# Patient Record
Sex: Female | Born: 1950 | Race: White | Hispanic: No | Marital: Married
Health system: Southern US, Community
[De-identification: ages and names within clinical notes are randomized; demographics above are authoritative.]

---

## 2020-05-29 ENCOUNTER — Ambulatory Visit: Payer: Medicare HMO | Admitting: Family Medicine

## 2020-05-29 ENCOUNTER — Ambulatory Visit: Payer: Self-pay

## 2020-05-29 ENCOUNTER — Encounter: Payer: Self-pay | Admitting: Family Medicine

## 2020-05-29 ENCOUNTER — Other Ambulatory Visit: Payer: Self-pay

## 2020-05-29 VITALS — BP 118/80 | HR 68 | Ht 63.75 in | Wt 123.0 lb

## 2020-05-29 DIAGNOSIS — M79672 Pain in left foot: Secondary | ICD-10-CM | POA: Diagnosis not present

## 2020-05-29 DIAGNOSIS — M216X1 Other acquired deformities of right foot: Secondary | ICD-10-CM | POA: Insufficient documentation

## 2020-05-29 DIAGNOSIS — D3610 Benign neoplasm of peripheral nerves and autonomic nervous system, unspecified: Secondary | ICD-10-CM | POA: Diagnosis not present

## 2020-05-29 MED ORDER — VITAMIN D (ERGOCALCIFEROL) 1.25 MG (50000 UNIT) PO CAPS
50000.0000 [IU] | ORAL_CAPSULE | ORAL | 0 refills | Status: DC
Start: 2020-05-29 — End: 2020-08-13

## 2020-05-29 NOTE — Assessment & Plan Note (Signed)
If worsening pain consider gabapentin or potential injections but I am not optimistic patient should do relatively well

## 2020-05-29 NOTE — Progress Notes (Signed)
[name] [place] Phone: [phone] Subjective:   Marissa [name], am serving as a scribe for Dr. Hulan Saas.  This visit occurred during the SARS-CoV-2 public health emergency.  Safety protocols were in place, including screening questions prior to the visit, additional usage of staff PPE, and extensive cleaning of exam room while observing appropriate contact time as indicated for disinfecting solutions.   I'm seeing this patient by the request  of:  Marissa [name]., PA-C  CC: Foot pain  Marissa [name]  Marissa [name] is a 69 y.o. female coming in with complaint of left foot pain. 11 years ago fractured her foot. After meds and therapy after years no relief went to Dr. Kathlen [name] and did ART therapy. Has had no pain in 8 years.  Onset- 1 year ago felt the pain again  Location- sesmoid bone  Character- aching ,numbness, and tingling Therapies tried- ice  Severity- irritating pain       Social History   Socioeconomic History  . Marital status: Married    Spouse name: Not on file  . Number of children: Not on file  . Years of education: Not on file  . Highest education level: Not on file  Occupational History  . Not on file  Tobacco Use  . Smoking status: Not on file  Substance and Sexual Activity  . Alcohol use: Not on file  . Drug use: Not on file  . Sexual activity: Not on file  Other Topics Concern  . Not on file  Social History Narrative  . Not on file   Social Determinants of Health   Financial Resource Strain:   . Difficulty of Paying Living Expenses:   Food Insecurity:   . Worried About Charity fundraiser in the Last Year:   . Arboriculturist in the Last Year:   Transportation Needs:   . Film/video editor (Medical):   Marland Kitchen Lack of Transportation (Non-Medical):   Physical Activity:   . Days of Exercise per Week:   . Minutes of Exercise per Session:   Stress:   . Feeling of Stress :    Social Connections:   . Frequency of Communication with Friends and Family:   . Frequency of Social Gatherings with Friends and Family:   . Attends Religious Services:   . Active Member of Clubs or Organizations:   . Attends Archivist Meetings:   Marland Kitchen Marital Status:    Allergies  Allergen Reactions  . Penicillins Other (See Comments) and Hives  . Codeine Other (See Comments)    Other  . Pseudoephedrine Hcl Nausea Only   No family history on file.       Current Outpatient Medications (Other):  Marland Kitchen  Vitamin D, Ergocalciferol, (DRISDOL) 1.25 MG (50000 UNIT) CAPS capsule, Take 1 capsule (50,000 Units total) by mouth every 7 (seven) days.   Reviewed prior external information including notes and imaging from  primary care provider As well as notes that were available from care everywhere and other healthcare systems.  Past medical history, social, surgical and family history all reviewed in electronic medical record.  No pertanent information unless stated regarding to the chief complaint.   Review of Systems:  No headache, visual changes, nausea, vomiting, diarrhea, constipation, dizziness, abdominal pain, skin rash, fevers, chills, night sweats, weight loss, swollen lymph nodes, body aches, joint swelling, chest pain, shortness of breath, mood changes. POSITIVE muscle aches  Objective  Blood pressure 118/80, pulse 68, height 5' 3.75" (1.619 m), weight 123 lb (55.8 kg), SpO2 99 %.   General: No apparent distress alert and oriented x3 mood and affect normal, dressed appropriately.  HEENT: Pupils equal, extraocular movements intact  Respiratory: Patient's speak in full sentences and does not appear short of breath  Cardiovascular: No lower extremity edema, non tender, no erythema  Neuro: Cranial nerves II through XII are intact, neurovascularly intact in all extremities with 2+ DTRs and 2+ pulses.  Gait normal with good balance and coordination.  MSK.  Patient's right  foot exam shows the patient does have some mild splaying between the first and second toes with breakdown of the transverse arch noted.  Mild positive squeeze test noted.  Patient sesamoid bone is nontender but have actually more tenderness between the second and third toes.  Relatively good range of motion of the first metatarsal.  Limited musculoskeletal ultrasound was performed and interpreted by Lyndal Pulley  Limited ultrasound of patient's foot shows the patient has minimal arthritic changes of the first MTP.  Trace effusion noted.  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  Patient does have what appears to be a very small neuroma starting between the second and third metatarsal toes and with compression patient has discomfort in this area.  Otherwise fairly unremarkable Impression: Neuroma    Impression and Recommendations:     The above documentation has been reviewed and is accurate and complete Lyndal Pulley, DO       Note: This dictation was prepared with Dragon dictation along with smaller phrase technology. Any transcriptional errors that result from this process are unintentional.

## 2020-05-29 NOTE — Assessment & Plan Note (Signed)
Transverse arch.  Patient does have breakdown and does have what appears to be more of a neuroma of the foot.  We discussed metatarsal pads, proper shoes, home exercises vitamin D supplementation, which activities to do which wants to avoid.  Follow-up again in 6 weeks

## 2020-05-29 NOTE — Patient Instructions (Addendum)
Good to see you Exercise 3 times a week Terrence Dupont or newton shoes Spenco "orthotics" Once weekly vitamin D Ice after activity See me again in 5-6 weeks

## 2020-06-08 ENCOUNTER — Encounter: Payer: Self-pay | Admitting: Family Medicine

## 2020-06-09 ENCOUNTER — Ambulatory Visit: Payer: Self-pay | Admitting: Family Medicine

## 2020-06-15 ENCOUNTER — Encounter: Payer: Self-pay | Admitting: Family Medicine

## 2020-07-03 ENCOUNTER — Other Ambulatory Visit: Payer: Self-pay

## 2020-07-03 ENCOUNTER — Ambulatory Visit: Payer: Medicare HMO | Admitting: Family Medicine

## 2020-07-03 ENCOUNTER — Encounter: Payer: Self-pay | Admitting: Family Medicine

## 2020-07-03 ENCOUNTER — Ambulatory Visit: Payer: Self-pay

## 2020-07-03 VITALS — BP 120/90 | HR 69 | Ht 63.75 in | Wt 121.0 lb

## 2020-07-03 DIAGNOSIS — D3610 Benign neoplasm of peripheral nerves and autonomic nervous system, unspecified: Secondary | ICD-10-CM | POA: Diagnosis not present

## 2020-07-03 DIAGNOSIS — M79672 Pain in left foot: Secondary | ICD-10-CM

## 2020-07-03 MED ORDER — PREDNISONE 50 MG PO TABS
ORAL_TABLET | ORAL | 0 refills | Status: DC
Start: 2020-07-03 — End: 2020-09-03

## 2020-07-03 MED ORDER — GABAPENTIN 100 MG PO CAPS
200.0000 mg | ORAL_CAPSULE | Freq: Every day | ORAL | 3 refills | Status: AC
Start: 2020-07-03 — End: ?

## 2020-07-03 NOTE — Progress Notes (Signed)
[facility] [address] [place] Phone: [phone] Subjective:   I Marissa [name] am serving as a Education administrator for Dr. Hulan Saas.  This visit occurred during the SARS-CoV-2 public health emergency.  Safety protocols were in place, including screening questions prior to the visit, additional usage of staff PPE, and extensive cleaning of exam room while observing appropriate contact time as indicated for disinfecting solutions.   I'm seeing this patient by the request  of:  Aletha Halim., PA-C  CC: Foot pain follow-up  [id number]   05/29/2020 Transverse arch.  Patient does have breakdown and does have what appears to be more of a neuroma of the foot.  We discussed metatarsal pads, proper shoes, home exercises vitamin D supplementation, which activities to do which wants to avoid.  Follow-up again in 6 weeks  If worsening pain consider gabapentin or potential injections but I am not optimistic patient should do relatively well  07/03/2020 Marissa [name] is a 69 y.o. female coming in with complaint of left foot pain. Patient states it took about 10 days to get orthotics so she has been wearing them for 4 weeks. Took a while to get used to and she thought she was getting better. Walking a mile a day. Good and bad days but yesterday she over did it. Walked a mile and a half and cleaned her house. Burning and aching. States she iced her foot. Last night the top of her toes turned red as well as the ball of her foot. There was a burning sensation. Overall thought she was getting better. Wants to go on vacation and walk and thought that increasing activity would be appropriate in order to prepare for the walking she will be doing on vacation. States she has had injections about 10-11 years ago and states they did not help. Exercising. Still taking vitamin D.   Onset-  Location Duration-  Character- Aggravating factors- Reliving factors-    Therapies tried-  Severity-     No past medical history on file. No past surgical history on file. Social History   Socioeconomic History  . Marital status: Married    Spouse name: Not on file  . Number of children: Not on file  . Years of education: Not on file  . Highest education level: Not on file  Occupational History  . Not on file  Tobacco Use  . Smoking status: Not on file  Substance and Sexual Activity  . Alcohol use: Not on file  . Drug use: Not on file  . Sexual activity: Not on file  Other Topics Concern  . Not on file  Social History Narrative  . Not on file   Social Determinants of Health   Financial Resource Strain:   . Difficulty of Paying Living Expenses:   Food Insecurity:   . Worried About Charity fundraiser in the Last Year:   . Arboriculturist in the Last Year:   Transportation Needs:   . Film/video editor (Medical):   Marland Kitchen Lack of Transportation (Non-Medical):   Physical Activity:   . Days of Exercise per Week:   . Minutes of Exercise per Session:   Stress:   . Feeling of Stress :   Social Connections:   . Frequency of Communication with Friends and Family:   . Frequency of Social Gatherings with Friends and Family:   . Attends Religious Services:   . Active Member of Clubs or Organizations:   .  Attends Archivist Meetings:   Marland Kitchen Marital Status:    Allergies  Allergen Reactions  . Penicillins Other (See Comments) and Hives  . Codeine Other (See Comments)    Other  . Pseudoephedrine Hcl Nausea Only   No family history on file.  Current Outpatient Medications (Endocrine & Metabolic):  .  predniSONE (DELTASONE) 50 MG tablet, 1 tablet by mouth daily      Current Outpatient Medications (Other):  Marland Kitchen  Vitamin D, Ergocalciferol, (DRISDOL) 1.25 MG (50000 UNIT) CAPS capsule, Take 1 capsule (50,000 Units total) by mouth every 7 (seven) days. Marland Kitchen  gabapentin (NEURONTIN) 100 MG capsule, Take 2 capsules (200 mg total) by mouth at  bedtime.   Reviewed prior external information including notes and imaging from  primary care provider As well as notes that were available from care everywhere and other healthcare systems.  Past medical history, social, surgical and family history all reviewed in electronic medical record.  No pertanent information unless stated regarding to the chief complaint.   Review of Systems:  No headache, visual changes, nausea, vomiting, diarrhea, constipation, dizziness, abdominal pain, skin rash, fevers, chills, night sweats, weight loss, swollen lymph nodes, body aches, joint swelling, chest pain, shortness of breath, mood changes. POSITIVE muscle aches  Objective  Blood pressure (!) 120/90, pulse 69, height 5' 3.75" (1.619 m), weight 121 lb (54.9 kg), SpO2 96 %.   General: No apparent distress alert and oriented x3 mood and affect normal, dressed appropriately.  HEENT: Pupils equal, extraocular movements intact  Respiratory: Patient's speak in full sentences and does not appear short of breath  Cardiovascular: No lower extremity edema, non tender, no erythema  Neuro: Cranial nerves II through XII are intact, neurovascularly intact in all extremities with 2+ DTRs and 2+ pulses.  Gait normal with good balance and coordination.  MSK:  Non tender with full range of motion and good stability and symmetric strength and tone of shoulders, elbows, wrist, hip, knee and ankles bilaterally.  Left foot exam shows a.  There is a breakdown of the longitudinal arch.  Patient does have a positive squeeze test noted.  No swelling no noted.  Mild rigid midfoot  Limited musculoskeletal ultrasound was performed and interpreted by Lyndal Pulley  Limited ultrasound shows the patient does have neuroma still noted in the second inter metatarsal space.  Hypoechoic changes.  No masses appreciated.  No neovascularization   Impression and Recommendations:     The above documentation has been reviewed and is  accurate and complete Lyndal Pulley, DO       Note: This dictation was prepared with Dragon dictation along with smaller phrase technology. Any transcriptional errors that result from this process are unintentional.

## 2020-07-03 NOTE — Assessment & Plan Note (Signed)
Patient is a neuroma of the foot but does not want to do any injection.  Is making slow progress.  Given gabapentin as well as now prednisone no for any type of breakthrough.  Discussed icing regimen.  Increase activity slowly.  Continue with the over-the-counter orthotics and good shoes.  Follow-up again 6 weeks

## 2020-07-03 NOTE — Patient Instructions (Addendum)
Good to see you Keep doing exercises Prednisone take on trip 7 days take it daily if pain starts Gabapentin 200 mg at night  Ice bath may be better See me again in 2 months

## 2020-08-13 ENCOUNTER — Other Ambulatory Visit: Payer: Self-pay | Admitting: Family Medicine

## 2020-08-14 ENCOUNTER — Other Ambulatory Visit: Payer: Self-pay

## 2020-09-02 NOTE — Progress Notes (Signed)
[place] Phone: [phone] Subjective:   Fontaine No, am serving as a scribe for Dr. Hulan Saas. This visit occurred during the SARS-CoV-2 public health emergency.  Safety protocols were in place, including screening questions prior to the visit, additional usage of staff PPE, and extensive cleaning of exam room while observing appropriate contact time as indicated for disinfecting solutions.   I'm seeing this patient by the request  of:  Aletha Halim., PA-C  CC: Foot pain follow-up  [id number]   07/03/2020 Patient is a neuroma of the foot but does not want to do any injection.  Is making slow progress.  Given gabapentin as well as now prednisone no for any type of breakthrough.  Discussed icing regimen.  Increase activity slowly.  Continue with the over-the-counter orthotics and good shoes.  Follow-up again 6 weeks  Update 09/03/2020 Marissa [name] is a 69 y.o. female coming in with complaint of left foot pain. Patient states that prednisone helped to decrease her pain while on vacation. Was able to walk 4 miles a day. Now that she is back in her routine at home, she has burning, numbness and tingling. Cannot figure out pattern to her pain. Using more supportive shoe and orthotics.  Overall feels approximately 50 to 60% better.      No past medical history on file. No past surgical history on file. Social History   Socioeconomic History  . Marital status: Married    Spouse name: Not on file  . Number of children: Not on file  . Years of education: Not on file  . Highest education level: Not on file  Occupational History  . Not on file  Tobacco Use  . Smoking status: Not on file  Substance and Sexual Activity  . Alcohol use: Not on file  . Drug use: Not on file  . Sexual activity: Not on file  Other Topics Concern  . Not on file  Social History Narrative  . Not on file   Social  Determinants of Health   Financial Resource Strain:   . Difficulty of Paying Living Expenses: Not on file  Food Insecurity:   . Worried About Charity fundraiser in the Last Year: Not on file  . Ran Out of Food in the Last Year: Not on file  Transportation Needs:   . Lack of Transportation (Medical): Not on file  . Lack of Transportation (Non-Medical): Not on file  Physical Activity:   . Days of Exercise per Week: Not on file  . Minutes of Exercise per Session: Not on file  Stress:   . Feeling of Stress : Not on file  Social Connections:   . Frequency of Communication with Friends and Family: Not on file  . Frequency of Social Gatherings with Friends and Family: Not on file  . Attends Religious Services: Not on file  . Active Member of Clubs or Organizations: Not on file  . Attends Archivist Meetings: Not on file  . Marital Status: Not on file   Allergies  Allergen Reactions  . Penicillins Other (See Comments) and Hives  . Codeine Other (See Comments)    Other  . Pseudoephedrine Hcl Nausea Only   No family history on file.       Current Outpatient Medications (Other):  .  gabapentin (NEURONTIN) 100 MG capsule, Take 2 capsules (200 mg total) by mouth at bedtime. .  Vitamin D, Ergocalciferol, (  DRISDOL) 1.25 MG (50000 UNIT) CAPS capsule, TAKE 1 CAPSULE (50,000 UNITS TOTAL) BY MOUTH EVERY 7 (SEVEN) DAYS.   Reviewed prior external information including notes and imaging from  primary care provider As well as notes that were available from care everywhere and other healthcare systems.  Past medical history, social, surgical and family history all reviewed in electronic medical record.  No pertanent information unless stated regarding to the chief complaint.   Review of Systems:  No headache, visual changes, nausea, vomiting, diarrhea, constipation, dizziness, abdominal pain, skin rash, fevers, chills, night sweats, weight loss, swollen lymph nodes, body aches,  joint swelling, chest pain, shortness of breath, mood changes. POSITIVE muscle aches  Objective  Blood pressure 112/72, pulse 69, height 5\' 3"  (1.6 m), weight 124 lb (56.2 kg), SpO2 96 %.   General: No apparent distress alert and oriented x3 mood and affect normal, dressed appropriately.  HEENT: Pupils equal, extraocular movements intact  Respiratory: Patient's speak in full sentences and does not appear short of breath  Cardiovascular: No lower extremity edema, non tender, no erythema  Neuro: Cranial nerves II through XII are intact, neurovascularly intact in all extremities with 2+ DTRs and 2+ pulses.  Gait normal with good balance and coordination.  MSK: Foot exam shows the patient has very mild breakdown of the transverse arch.  Negative squeeze test.  Patient's phalanx of the fifth toe and does have some mild swelling from a new injury.  Patient's ankle full range of motion.  Limited musculoskeletal ultrasound was performed and interpreted Lyndal Pulley  Limited ultrasound of patient's left foot shows no significant enlargement of the neuroma today.  No cortical irregularities anywhere except for the fifth phalanx but has what it seems to be a resolving fracture noted.    Impression and Recommendations:     The above documentation has been reviewed and is accurate and complete Lyndal Pulley, DO       Note: This dictation was prepared with Dragon dictation along with smaller phrase technology. Any transcriptional errors that result from this process are unintentional.

## 2020-09-03 ENCOUNTER — Ambulatory Visit: Payer: Self-pay

## 2020-09-03 ENCOUNTER — Encounter: Payer: Self-pay | Admitting: Family Medicine

## 2020-09-03 ENCOUNTER — Ambulatory Visit (INDEPENDENT_AMBULATORY_CARE_PROVIDER_SITE_OTHER): Payer: Medicare HMO | Admitting: Family Medicine

## 2020-09-03 ENCOUNTER — Other Ambulatory Visit: Payer: Self-pay

## 2020-09-03 VITALS — BP 112/72 | HR 69 | Ht 63.0 in | Wt 124.0 lb

## 2020-09-03 DIAGNOSIS — M79672 Pain in left foot: Secondary | ICD-10-CM

## 2020-09-03 DIAGNOSIS — D3610 Benign neoplasm of peripheral nerves and autonomic nervous system, unspecified: Secondary | ICD-10-CM

## 2020-09-03 NOTE — Assessment & Plan Note (Signed)
No sign of any neuroma noted.  Patient is doing much better.  Encouraged her to continue to do the gabapentin but can cut down to 100 mg.  Discussed the vitamin D supplementation to more over-the-counter.  I believe patient should do relatively well.  Discussed icing regimen and home exercise.  Patient was looking at the possibility of food exacerbating this and we discussed potentially online ones that could be beneficial.  Follow-up with me again 2 months

## 2020-09-03 NOTE — Patient Instructions (Addendum)
[email or web address] for food sensitivity testing 2000-4000IU Vit D Daily Tart Cherry extract 1200mg  at night [email or web address] See me once more in 2 months

## 2020-09-15 ENCOUNTER — Encounter: Payer: Self-pay | Admitting: Family Medicine

## 2020-09-22 ENCOUNTER — Ambulatory Visit (INDEPENDENT_AMBULATORY_CARE_PROVIDER_SITE_OTHER): Payer: Medicare HMO

## 2020-09-22 ENCOUNTER — Encounter: Payer: Self-pay | Admitting: Family Medicine

## 2020-09-22 ENCOUNTER — Other Ambulatory Visit: Payer: Self-pay

## 2020-09-22 DIAGNOSIS — M79672 Pain in left foot: Secondary | ICD-10-CM

## 2020-09-24 ENCOUNTER — Ambulatory Visit: Payer: Self-pay

## 2020-09-24 ENCOUNTER — Ambulatory Visit: Payer: Medicare HMO | Admitting: Family Medicine

## 2020-09-24 ENCOUNTER — Other Ambulatory Visit: Payer: Self-pay

## 2020-09-24 VITALS — BP 102/68 | HR 56 | Ht 63.0 in | Wt 129.0 lb

## 2020-09-24 DIAGNOSIS — M79672 Pain in left foot: Secondary | ICD-10-CM | POA: Diagnosis not present

## 2020-09-24 DIAGNOSIS — M216X1 Other acquired deformities of right foot: Secondary | ICD-10-CM

## 2020-09-24 MED ORDER — PREDNISONE 50 MG PO TABS: ORAL_TABLET | ORAL | 0 refills | Status: DC

## 2020-09-24 NOTE — Progress Notes (Signed)
[place] Phone: [phone] Subjective:   Marissa [name], am serving as a scribe for Dr. Hulan Saas. This visit occurred during the SARS-CoV-2 public health emergency.  Safety protocols were in place, including screening questions prior to the visit, additional usage of staff PPE, and extensive cleaning of exam room while observing appropriate contact time as indicated for disinfecting solutions.   I'm seeing this patient by the request  of:  Deatra Ina, Kristen W., PA-C  CC: left foot pain   [id number]   09/03/2020 [name] sign of any neuroma noted.  Patient is doing much better.  Encouraged her to continue to do the gabapentin but can cut down to 100 mg.  Discussed the vitamin D supplementation to more over-the-counter.  I believe patient should do relatively well.  Discussed icing regimen and home exercise.  Patient was looking at the possibility of food exacerbating this and we discussed potentially online ones that could be beneficial.  Follow-up with me again 2 months   Update 09/24/2020 Marissa [name] is a 69 y.o. female coming in with complaint of left foot pain. Patient states that she wore some new riding boots and within 30 minutes her pinky toe has been painful. Pain over all the metatarsal heads. The only thing different was new motorcycle boots and now continues to have pain   Pain from neuroma has improved.     New xrays taken today- [name] sign of any bony abnormality  [name] past medical history on file. [name] past surgical history on file. Social History   Socioeconomic History  . Marital status: Married    Spouse name: Not on file  . Number of children: Not on file  . Years of education: Not on file  . Highest education level: Not on file  Occupational History  . Not on file  Tobacco Use  . Smoking status: Not on file  Substance and Sexual Activity  . Alcohol use: Not on file  . Drug use: Not on file   . Sexual activity: Not on file  Other Topics Concern  . Not on file  Social History Narrative  . Not on file   Social Determinants of Health   Financial Resource Strain:   . Difficulty of Paying Living Expenses: Not on file  Food Insecurity:   . Worried About Charity fundraiser in the Last Year: Not on file  . Ran Out of Food in the Last Year: Not on file  Transportation Needs:   . Lack of Transportation (Medical): Not on file  . Lack of Transportation (Non-Medical): Not on file  Physical Activity:   . Days of Exercise per Week: Not on file  . Minutes of Exercise per Session: Not on file  Stress:   . Feeling of Stress : Not on file  Social Connections:   . Frequency of Communication with Friends and Family: Not on file  . Frequency of Social Gatherings with Friends and Family: Not on file  . Attends Religious Services: Not on file  . Active Member of Clubs or Organizations: Not on file  . Attends Archivist Meetings: Not on file  . Marital Status: Not on file   Allergies  Allergen Reactions  . Penicillins Other (See Comments) and Hives  . Codeine Other (See Comments)    Other  . Pseudoephedrine Hcl Nausea Only   [name] family history on file.  Current Outpatient Medications (Endocrine & Metabolic):  .  predniSONE (DELTASONE) 50 MG tablet, Take one tablet daily for the next 5 days.      Current Outpatient Medications (Other):  .  gabapentin (NEURONTIN) 100 MG capsule, Take 2 capsules (200 mg total) by mouth at bedtime. .  Vitamin D, Ergocalciferol, (DRISDOL) 1.25 MG (50000 UNIT) CAPS capsule, TAKE 1 CAPSULE (50,000 UNITS TOTAL) BY MOUTH EVERY 7 (SEVEN) DAYS.   Reviewed prior external information including notes and imaging from  primary care provider As well as notes that were available from care everywhere and other healthcare systems.  Past medical history, social, surgical and family history all reviewed in electronic medical record.  [name] pertanent  information unless stated regarding to the chief complaint.   Review of Systems:  [name] headache, visual changes, nausea, vomiting, diarrhea, constipation, dizziness, abdominal pain, skin rash, fevers, chills, night sweats, weight loss, swollen lymph nodes, body aches, joint swelling, chest pain, shortness of breath, mood changes. POSITIVE muscle aches  Objective  Blood pressure 102/68, pulse (!) 56, height 5\' 3"  (1.6 m), weight 129 lb (58.5 kg), SpO2 99 %.   General: [name] apparent distress alert and oriented x3 mood and affect normal, dressed appropriately.  HEENT: Pupils equal, extraocular movements intact  Respiratory: Patient's speak in full sentences and does not appear short of breath  Cardiovascular: [name] lower extremity edema, non tender, [name] erythema  Neuro: Cranial nerves II through XII are intact, neurovascularly intact in all extremities with 2+ DTRs and 2+ pulses.  Gait normal with good balance and coordination.  MSK:  Left foot exam still shows breakdown of transverse arch, mild enlargemnt of the 5th toe, NVI, patient though does have cold toes. Mild redness of the skin.   Ltd MSK [place] was preformed and independently visualized by Lyndal Pulley  [name] bony abnormality noted, [name] sign of soft tissue swelling, [name] increase in size of the neuroma       Impression and Recommendations:     The above documentation has been reviewed and is accurate and complete Lyndal Pulley, DO

## 2020-09-24 NOTE — Patient Instructions (Signed)
Prednisone 50mg  daily for next 5 days I think its a nerve or blood vessel Continue gabapentin Wool socks when out of house Possibly take boots back Send me a message Wednesday See me again in 4 weeks

## 2020-09-26 ENCOUNTER — Encounter: Payer: Self-pay | Admitting: Family Medicine

## 2020-09-26 NOTE — Assessment & Plan Note (Signed)
I believe increase irritation to skin but does have signs of possible raynauds syndrome, seems to be eight vascular or neurologic, encouraged the continuation of gabapentin discussed warm socks, avoid significant temperature changes, avoid barefoot in the house, prednisone given for 5 days if no improvement would consider ABI , NCS and consider MRI with no abnormal finding noted at moment.

## 2020-09-29 ENCOUNTER — Other Ambulatory Visit: Payer: Self-pay

## 2020-09-29 ENCOUNTER — Encounter: Payer: Self-pay | Admitting: Family Medicine

## 2020-09-29 DIAGNOSIS — M79672 Pain in left foot: Secondary | ICD-10-CM

## 2020-09-29 NOTE — Progress Notes (Unsigned)
M

## 2020-10-10 ENCOUNTER — Ambulatory Visit
Admission: RE | Admit: 2020-10-10 | Discharge: 2020-10-10 | Disposition: A | Discharge status: Home / Self Care | Payer: Medicare HMO | Source: Ambulatory Visit | Attending: Family Medicine | Admitting: Family Medicine

## 2020-10-10 ENCOUNTER — Other Ambulatory Visit: Payer: Self-pay

## 2020-10-10 DIAGNOSIS — M79672 Pain in left foot: Secondary | ICD-10-CM

## 2020-10-12 ENCOUNTER — Encounter: Payer: Self-pay | Admitting: Family Medicine

## 2020-10-12 ENCOUNTER — Encounter: Payer: Self-pay | Admitting: Neurology

## 2020-10-12 ENCOUNTER — Other Ambulatory Visit: Payer: Self-pay

## 2020-10-12 DIAGNOSIS — M79672 Pain in left foot: Secondary | ICD-10-CM

## 2020-10-12 DIAGNOSIS — D3610 Benign neoplasm of peripheral nerves and autonomic nervous system, unspecified: Secondary | ICD-10-CM

## 2020-10-13 ENCOUNTER — Ambulatory Visit (HOSPITAL_COMMUNITY)
Admission: RE | Admit: 2020-10-13 | Discharge: 2020-10-13 | Disposition: A | Discharge status: Home / Self Care | Payer: Medicare HMO | Source: Ambulatory Visit | Attending: Cardiology | Admitting: Cardiology

## 2020-10-13 ENCOUNTER — Other Ambulatory Visit: Payer: Self-pay

## 2020-10-13 DIAGNOSIS — M79672 Pain in left foot: Secondary | ICD-10-CM | POA: Diagnosis not present

## 2020-10-20 ENCOUNTER — Other Ambulatory Visit: Payer: Self-pay

## 2020-10-20 ENCOUNTER — Ambulatory Visit: Payer: Medicare HMO | Admitting: Family Medicine

## 2020-10-20 ENCOUNTER — Encounter: Payer: Self-pay | Admitting: Family Medicine

## 2020-10-20 ENCOUNTER — Ambulatory Visit: Payer: Self-pay

## 2020-10-20 VITALS — BP 124/74 | HR 80 | Ht 63.0 in | Wt 126.0 lb

## 2020-10-20 DIAGNOSIS — M79672 Pain in left foot: Secondary | ICD-10-CM | POA: Diagnosis not present

## 2020-10-20 DIAGNOSIS — M216X2 Other acquired deformities of left foot: Secondary | ICD-10-CM | POA: Diagnosis not present

## 2020-10-20 DIAGNOSIS — M216X1 Other acquired deformities of right foot: Secondary | ICD-10-CM

## 2020-10-20 NOTE — Progress Notes (Signed)
[place] Phone: [phone] Subjective:   Fontaine No, am serving as a scribe for Dr. Hulan Saas. This visit occurred during the SARS-CoV-2 public health emergency.  Safety protocols were in place, including screening questions prior to the visit, additional usage of staff PPE, and extensive cleaning of exam room while observing appropriate contact time as indicated for disinfecting solutions.   I'm seeing this patient by the request  of:  Deatra Ina, Kristen W., PA-C  CC: Left foot pain follow-up.  DGL:[id number]   09/24/2020 I believe increase irritation to skin but does have signs of possible raynauds syndrome, seems to be eight vascular or neurologic, encouraged the continuation of gabapentin discussed warm socks, avoid significant temperature changes, avoid barefoot in the house, prednisone given for 5 days if no improvement would consider ABI , NCS and consider MRI with no abnormal finding noted at moment.   Update 10/20/2020 Marissa [name] is a 69 y.o. female coming in with complaint of left foot pain. Patient states that she had pain in 5th toe and across the 4th metatarsal head. Patient notes the her toes will be red at the end of the day. Tight shoes seems to make it worse.  States that it is still the fifth toe.  Patient does bring a picture that shows her toes were red compared to the contralateral side with some mild swelling.   Patient's pain in the foot was proportion to the mild swelling noted on exam with ultrasound over the x-rays.  Patient did have an MRI.  MRI showed no significant findings including no significant neuroma.  Patient then was sent for a Doppler showing no sign of any type of difficulty with blood flow.  Patient is awaiting a nerve conduction study and an ABI.  No past medical history on file. No past surgical history on file. Social History   Socioeconomic History  . Marital  status: Married    Spouse name: Not on file  . Number of children: Not on file  . Years of education: Not on file  . Highest education level: Not on file  Occupational History  . Not on file  Tobacco Use  . Smoking status: Not on file  Substance and Sexual Activity  . Alcohol use: Not on file  . Drug use: Not on file  . Sexual activity: Not on file  Other Topics Concern  . Not on file  Social History Narrative  . Not on file   Social Determinants of Health   Financial Resource Strain:   . Difficulty of Paying Living Expenses: Not on file  Food Insecurity:   . Worried About Charity fundraiser in the Last Year: Not on file  . Ran Out of Food in the Last Year: Not on file  Transportation Needs:   . Lack of Transportation (Medical): Not on file  . Lack of Transportation (Non-Medical): Not on file  Physical Activity:   . Days of Exercise per Week: Not on file  . Minutes of Exercise per Session: Not on file  Stress:   . Feeling of Stress : Not on file  Social Connections:   . Frequency of Communication with Friends and Family: Not on file  . Frequency of Social Gatherings with Friends and Family: Not on file  . Attends Religious Services: Not on file  . Active Member of Clubs or Organizations: Not on file  . Attends Archivist Meetings: Not  on file  . Marital Status: Not on file   Allergies  Allergen Reactions  . Penicillins Other (See Comments) and Hives  . Codeine Other (See Comments)    Other  . Pseudoephedrine Hcl Nausea Only   No family history on file.  Current Outpatient Medications (Endocrine & Metabolic):  .  predniSONE (DELTASONE) 50 MG tablet, Take one tablet daily for the next 5 days.      Current Outpatient Medications (Other):  .  gabapentin (NEURONTIN) 100 MG capsule, Take 2 capsules (200 mg total) by mouth at bedtime. .  Vitamin D, Ergocalciferol, (DRISDOL) 1.25 MG (50000 UNIT) CAPS capsule, TAKE 1 CAPSULE (50,000 UNITS TOTAL) BY MOUTH  EVERY 7 (SEVEN) DAYS.   Reviewed prior external information including notes and imaging from  primary care provider As well as notes that were available from care everywhere and other healthcare systems.  Past medical history, social, surgical and family history all reviewed in electronic medical record.  No pertanent information unless stated regarding to the chief complaint.   Review of Systems:  No headache, visual changes, nausea, vomiting, diarrhea, constipation, dizziness, abdominal pain, skin rash, fevers, chills, night sweats, weight loss, swollen lymph nodes, body aches, joint swelling, chest pain, shortness of breath, mood changes. POSITIVE muscle aches  Objective  Blood pressure 124/74, pulse 80, height 5\' 3"  (1.6 m), weight 126 lb (57.2 kg), SpO2 98 %.   General: No apparent distress alert and oriented x3 mood and affect normal, dressed appropriately.  HEENT: Pupils equal, extraocular movements intact  Respiratory: Patient's speak in full sentences and does not appear short of breath  Cardiovascular: No lower extremity edema, non tender, no erythema  Left foot exam shows the patient does have some very mild swelling versus callus formation over the phalanx.  Patient has some tenderness no noted at the fourth and fifth metatarsal heads.  No masses appreciated.  Negative squeeze test noted at the moment.  No significant swelling noted.  Breakdown of the transverse arch is noted.  Limited musculoskeletal ultrasound was performed and interpreted Lyndal Pulley  Limited ultrasound of patient's fifth phalanx does not show any true cortical irregularity noted.  No significant soft tissue swelling.  Does have some mild arthritic changes noted of the DIP joint No neuroma noted at today. Impression: Questionable small arthritic changes of the fifth MTP.   Impression and Recommendations:     The above documentation has been reviewed and is accurate and complete Lyndal Pulley,  DO

## 2020-10-20 NOTE — Assessment & Plan Note (Signed)
Patient continues to have trouble more with this.  Patient's MRI was completely unremarkable, last 2 ultrasounds also fairly unremarkable.  Patient is so far Doppler was unremarkable and awaiting ABI and nerve conduction test.  If all is normal differential includes irritation of the skin or potentially reflect sympathetic dystrophy.  If so I would consider the possibility of a low-dose Cymbalta.  Could also consider the possibility of increasing gabapentin.  Discussed icing regimen and home exercises otherwise and will send to formal physical therapy for evaluation and see if anything else could help with some of the pain.  May need to consider the possibility of custom orthotics.  Follow-up with me again in 6 to 8 weeks

## 2020-10-20 NOTE — Patient Instructions (Signed)
I think it's a callus from mild arthritis I still think the tests are a good idea We may consider starting Cymbalta if tests are normal for [facility] PT at [facility]  See me 5-6 weeks after all of the tests

## 2020-10-26 ENCOUNTER — Other Ambulatory Visit: Payer: Self-pay | Admitting: Family Medicine

## 2020-10-26 DIAGNOSIS — M79672 Pain in left foot: Secondary | ICD-10-CM

## 2020-10-27 ENCOUNTER — Other Ambulatory Visit: Payer: Self-pay

## 2020-10-27 ENCOUNTER — Ambulatory Visit (HOSPITAL_COMMUNITY)
Admission: RE | Admit: 2020-10-27 | Discharge: 2020-10-27 | Disposition: A | Discharge status: Home / Self Care | Payer: Medicare HMO | Source: Ambulatory Visit | Attending: Cardiovascular Disease | Admitting: Cardiovascular Disease

## 2020-10-27 DIAGNOSIS — M79672 Pain in left foot: Secondary | ICD-10-CM | POA: Diagnosis not present

## 2020-11-03 ENCOUNTER — Ambulatory Visit: Payer: Medicare HMO | Admitting: Family Medicine

## 2020-11-10 ENCOUNTER — Encounter: Payer: Self-pay | Admitting: Physical Therapy

## 2020-11-10 ENCOUNTER — Other Ambulatory Visit: Payer: Self-pay

## 2020-11-10 ENCOUNTER — Ambulatory Visit: Payer: Medicare HMO | Admitting: Physical Therapy

## 2020-11-10 DIAGNOSIS — M25572 Pain in left ankle and joints of left foot: Secondary | ICD-10-CM

## 2020-11-10 DIAGNOSIS — M79675 Pain in left toe(s): Secondary | ICD-10-CM

## 2020-11-10 NOTE — Therapy (Signed)
[facility] [address] [place], [place], [address] Phone: [phone]   Fax:  [phone]  Physical Therapy Evaluation  Patient Details  Name: Marissa [name] MRN: [id number] Date of Birth: [birth year] Referring Provider (PT): Charlann Boxer   Encounter Date: 11/10/2020   PT End of Session - 11/10/20 1143    Visit Number 1    Number of Visits 12    Date for PT Re-Evaluation 12/22/20    Authorization Type Aetna    Activity Tolerance Patient tolerated treatment well    Behavior During Therapy [facility] for tasks assessed/performed           History reviewed. No pertinent past medical history.  History reviewed. No pertinent surgical history.  There were no vitals filed for this visit.    Subjective Assessment - 11/10/20 1139    Subjective Pt states ongoing difficulty with L foot. In the past had sesmoiditis 10 yr ago, Recently had neuroma which has improved, had 5th toe break when she stubbed it, also healed. This summer wore different boots for 2 hrs, and has had severe pain since then in L 4-5 toes. Pt wearing orthotics and shoes with wide toe box. Able to walk about 1 mi for exercise, but has increased pain with increased standing/activity that has not improved. Other recent MRI negative.    Limitations Standing;Walking    Patient Stated Goals decreased pain    Currently in Pain? Yes    Pain Score 7     Pain Location Foot    Pain Orientation Left    Pain Descriptors / Indicators Aching;Throbbing    Pain Type Acute pain;Chronic pain    Pain Onset More than a month ago    Pain Frequency Intermittent    Aggravating Factors  standing, walking              [facility] PT Assessment - 11/10/20 0001      Assessment   Medical Diagnosis Pain in L foot    Referring Provider (PT) Charlann Boxer    Prior Therapy no      Balance Screen   Has the patient fallen in the past 6 months No      Prior Function   Level of Independence Independent       Cognition   Overall Cognitive Status Within Functional Limits for tasks assessed      ROM / Strength   AROM / PROM / Strength AROM;Strength      AROM   Overall AROM Comments foot: WNL, ankle: mild limitation for DF       Strength   Overall Strength Comments Ankle: 4/5 gross,  Toes: 4/5 gross      Palpation   Palpation comment Pain in dorsal interossei 3-4, mild in 4-5,  Soreness in 4 and 5 MTP      Special Tests   Other special tests states hypersensitivity to heat, increased skin redness and pain.;  No increased pain with SLS or with heel raise today on L.       Ambulation/Gait   Gait Comments unremarkable gait for ankle, foot, toe dysfunction                      Objective measurements completed on examination: See above findings.       [facility] Adult PT Treatment/Exercise - 11/10/20 0001      Self-Care   Self-Care Other Self-Care Comments    Other Self-Care Comments  Met pad placed in  L shoe (small) discussed use, wear precautions and placement.       Exercises   Exercises Ankle      Manual Therapy   Manual Therapy Joint mobilization;Soft tissue mobilization;Taping    Joint Mobilization distraction for toes 3-5,      Soft tissue mobilization STM/DTM for 3-5  dorsal interossei     Kinesiotex Create Space      Kinesiotix   Create Space 1 I strip for L metatarsal decompression 4-5                  PT Education - 11/10/20 1142    Education Details PT POC, exam findings, Discussed use of toe spreader for stretching,  Educated on use of K-tape, and use of Met pad placed in L shoe today .    Person(s) Educated Patient    Methods Explanation;Demonstration;Verbal cues;Tactile cues    Comprehension Verbalized understanding;Returned demonstration;Verbal cues required;Tactile cues required;Need further instruction            PT Short Term Goals - 11/10/20 1153      PT SHORT TERM GOAL #1   Title Pt to be independent with initial HEP    Time 2     Period Weeks    Status New    Target Date 11/24/20      PT SHORT TERM GOAL #2   Title Pt to be compliant with wearing met pad, for pressure/ pain relief.    Time 2    Period Weeks    Status New    Target Date 11/24/20             PT Long Term Goals - 11/10/20 1154      PT LONG TERM GOAL #1   Title Pt to be independent with final HEP    Time 6    Period Weeks    Status New    Target Date 12/22/20      PT LONG TERM GOAL #2   Title Pt to report decreased pain in L foot/toes to 0-2/10 with standing/ambulation    Time 6    Period Weeks    Status New    Target Date 12/22/20      PT LONG TERM GOAL #3   Title Pt to demo improved ability for ambulation up to 1 hr without pain in foot greater thatn 2/10    Time 6    Period Weeks    Status New    Target Date 12/22/20      PT LONG TERM GOAL #4   Title Pt to be compliant with footwear and/or orthotics as approprate for her foot type for pressure relief and pain relief;    Time 6    Period Weeks    Status New    Target Date 12/22/20                  Plan - 11/10/20 1146    Clinical Impression Statement Pt presents with primary complaint of increased pain in L foot, toes 4-5. She has had recent negative MRI, but has significant pain with increased pressure, standing, walking and palpation. Pt with sorenes in interossei with palpation and STM. Minimal other deficits in foot/ankle seen today. Pt wearing shoes appropriate for foot type with wide toe box. Pt with decreased ability for full functional activities due to pain. Pt to benefit from skilled care for pain relief, manual tissue release, modalities, and met pad and/or orthotic intervention. Pt also with some  likelihood of CRPS due to pain presentation .    Examination-Activity Limitations Stand;Squat;Stairs    Examination-Participation Restrictions Church;Cleaning;Meal Prep;Community Activity;Shop    Stability/Clinical Decision Making Evolving/Moderate complexity     Clinical Decision Making Moderate    Rehab Potential Good    PT Frequency 2x / week    PT Duration 6 weeks    PT Treatment/Interventions ADLs/Self Care Home Management;Cryotherapy;Electrical Stimulation;Iontophoresis 61m/ml Dexamethasone;Moist Heat;Traction;Ultrasound;Therapeutic exercise;Therapeutic activities;Functional mobility training;Stair training;Gait training;DME Instruction;Balance training;Neuromuscular re-education;Patient/family education;Orthotic Fit/Training;Manual techniques;Passive range of motion;Dry needling;Taping;Vasopneumatic Device;Joint Manipulations;Spinal Manipulations    Consulted and Agree with Plan of Care Patient           Patient will benefit from skilled therapeutic intervention in order to improve the following deficits and impairments:  Pain, Increased muscle spasms, Decreased activity tolerance, Decreased strength, Difficulty walking  Visit Diagnosis: Pain in left ankle and joints of left foot  Pain in toe of left foot     Problem List Patient Active Problem List   Diagnosis Date Noted  . Left foot pain 10/20/2020  . 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 Neuroma 05/29/2020    LLyndee Hensen PT, DPT 12:09 PM  11/10/20    [place] [address]Phone: [phone]  Fax:  [phone] Name: KDaisja KessingerMRN: [id number] of Birth: 1Oct 09, 1952

## 2020-11-12 ENCOUNTER — Other Ambulatory Visit: Payer: Self-pay

## 2020-11-12 ENCOUNTER — Ambulatory Visit: Payer: Medicare HMO | Admitting: Physical Therapy

## 2020-11-12 DIAGNOSIS — M79675 Pain in left toe(s): Secondary | ICD-10-CM

## 2020-11-12 DIAGNOSIS — M25572 Pain in left ankle and joints of left foot: Secondary | ICD-10-CM | POA: Diagnosis not present

## 2020-11-13 ENCOUNTER — Other Ambulatory Visit: Payer: Self-pay

## 2020-11-13 DIAGNOSIS — R202 Paresthesia of skin: Secondary | ICD-10-CM

## 2020-11-15 ENCOUNTER — Encounter: Payer: Self-pay | Admitting: Physical Therapy

## 2020-11-15 NOTE — Therapy (Signed)
[facility] [address] [place], [place], [address] Phone: [phone]   Fax:  [phone]  Physical Therapy Treatment  Patient Details  Name: Marissa [name] MRN: [id number] Date of Birth: 1951-05-03 Referring Provider (PT): Charlann Boxer   Encounter Date: 11/12/2020   PT End of Session - 11/15/20 1539    Visit Number 2    Number of Visits 12    Date for PT Re-Evaluation 12/22/20    Authorization Type Aetna    PT Start Time 1601    PT Stop Time 1641    PT Time Calculation (min) 40 min    Activity Tolerance Patient tolerated treatment well    Behavior During Therapy [facility] for tasks assessed/performed           History reviewed. No pertinent past medical history.  History reviewed. No pertinent surgical history.  There were no vitals filed for this visit.   Subjective Assessment - 11/15/20 1538    Subjective Pt with good tolerance for met pad and tape. States she did a lot of walking/activity today and has minimal soreness.    Currently in Pain? Yes    Pain Score 3     Pain Location Foot    Pain Orientation Left    Pain Descriptors / Indicators Aching;Throbbing    Pain Type Acute pain;Chronic pain    Pain Onset More than a month ago    Pain Frequency Intermittent                             [facility] Adult PT Treatment/Exercise - 11/15/20 0001      Exercises   Exercises Ankle      Modalities   Modalities Ultrasound      Ultrasound   Ultrasound Location L foot, 3-5 distal mets.    Ultrasound Parameters 1.2 w/cm2, 50 %    Ultrasound Goals Pain      Manual Therapy   Manual Therapy Joint mobilization;Soft tissue mobilization;Taping    Joint Mobilization distraction for toes 3-5,      Soft tissue mobilization STM/DTM for 3-5  dorsal interossei     Kinesiotex Create Space      Kinesiotix   Create Space 1 I strip for L metatarsal decompression 4-5      Ankle Exercises: Stretches   Gastroc Stretch  Limitations 30 sec x 3;      Ankle Exercises: Aerobic   Recumbent Bike L1 x 5 min;      Ankle Exercises: Standing   SLS 30sec x 3 bil;    Heel Raises 15 reps    Other Standing Ankle Exercises Tandem stance 30 sec x 3 bil;  staggered stance weight shifts x 20 bil;                    PT Short Term Goals - 11/10/20 1153      PT SHORT TERM GOAL #1   Title Pt to be independent with initial HEP    Time 2    Period Weeks    Status New    Target Date 11/24/20      PT SHORT TERM GOAL #2   Title Pt to be compliant with wearing met pad, for pressure/ pain relief.    Time 2    Period Weeks    Status New    Target Date 11/24/20             PT  Long Term Goals - 11/10/20 1154      PT LONG TERM GOAL #1   Title Pt to be independent with final HEP    Time 6    Period Weeks    Status New    Target Date 12/22/20      PT LONG TERM GOAL #2   Title Pt to report decreased pain in L foot/toes to 0-2/10 with standing/ambulation    Time 6    Period Weeks    Status New    Target Date 12/22/20      PT LONG TERM GOAL #3   Title Pt to demo improved ability for ambulation up to 1 hr without pain in foot greater thatn 2/10    Time 6    Period Weeks    Status New    Target Date 12/22/20      PT LONG TERM GOAL #4   Title Pt to be compliant with footwear and/or orthotics as approprate for her foot type for pressure relief and pain relief;    Time 6    Period Weeks    Status New    Target Date 12/22/20                 Plan - 11/15/20 1542    Clinical Impression Statement Pt with soreness at 3-4 metatarsal and interossei. Manual , [place], and taping done for pain. Pt will continue to wear Met pad and use toe spacer at home. Ther ex progressed for light strengthening. Plan to progress as tolerate.d    Examination-Activity Limitations Stand;Squat;Stairs    Examination-Participation Restrictions Church;Cleaning;Meal Prep;Community Activity;Shop    Stability/Clinical Decision  Making Evolving/Moderate complexity    Rehab Potential Good    PT Frequency 2x / week    PT Duration 6 weeks    PT Treatment/Interventions ADLs/Self Care Home Management;Cryotherapy;Electrical Stimulation;Iontophoresis 2m/ml Dexamethasone;Moist Heat;Traction;Ultrasound;Therapeutic exercise;Therapeutic activities;Functional mobility training;Stair training;Gait training;DME Instruction;Balance training;Neuromuscular re-education;Patient/family education;Orthotic Fit/Training;Manual techniques;Passive range of motion;Dry needling;Taping;Vasopneumatic Device;Joint Manipulations;Spinal Manipulations    Consulted and Agree with Plan of Care Patient           Patient will benefit from skilled therapeutic intervention in order to improve the following deficits and impairments:  Pain,Increased muscle spasms,Decreased activity tolerance,Decreased strength,Difficulty walking  Visit Diagnosis: Pain in left ankle and joints of left foot  Pain in toe of left foot     Problem List Patient Active Problem List   Diagnosis Date Noted   Left foot pain 10/20/2020   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

## 2020-11-16 ENCOUNTER — Other Ambulatory Visit: Payer: Self-pay

## 2020-11-16 ENCOUNTER — Encounter: Payer: Self-pay | Admitting: Physical Therapy

## 2020-11-16 ENCOUNTER — Ambulatory Visit: Payer: Medicare HMO | Admitting: Physical Therapy

## 2020-11-16 DIAGNOSIS — M25572 Pain in left ankle and joints of left foot: Secondary | ICD-10-CM

## 2020-11-16 DIAGNOSIS — M79675 Pain in left toe(s): Secondary | ICD-10-CM

## 2020-11-16 NOTE — Therapy (Signed)
[facility] [address] [place], [place], [address] Phone: [phone]   Fax:  [phone]  Physical Therapy Treatment  Patient Details  Name: Marissa [name] MRN: [id number] Date of Birth: 1951/04/02 Referring Provider (PT): Charlann Boxer   Encounter Date: 11/16/2020   PT End of Session - 11/16/20 1152    Visit Number 3    Number of Visits 12    Date for PT Re-Evaluation 12/22/20    Authorization Type Aetna    PT Start Time [phone]    PT Stop Time 0931    PT Time Calculation (min) 45 min    Activity Tolerance Patient tolerated treatment well    Behavior During Therapy [facility] for tasks assessed/performed           History reviewed. No pertinent past medical history.  History reviewed. No pertinent surgical history.  There were no vitals filed for this visit.   Subjective Assessment - 11/16/20 0852    Subjective Pt states increased soreness last friday, after increased activity on Thursday, and standing on feet a lot. Is wearing met pad as tolerated, some days she takes out if sore on bottom of foot, but thinks it is helping pain in metatarsal region.    Currently in Pain? Yes    Pain Score 5     Pain Location Foot    Pain Orientation Left    Pain Descriptors / Indicators Aching;Throbbing    Pain Type Acute pain;Chronic pain    Pain Onset More than a month ago    Pain Frequency Intermittent                             [facility] Adult PT Treatment/Exercise - 11/16/20 0001      Exercises   Exercises Ankle      Modalities   Modalities Ultrasound      Ultrasound   Ultrasound Location L foot , 3-5 distal mets    Ultrasound Parameters 1.2 w/cm2, 50 %    Ultrasound Goals Pain      Manual Therapy   Manual Therapy Joint mobilization;Soft tissue mobilization;Taping    Joint Mobilization distraction for toes 3-5,      Soft tissue mobilization STM/DTM for 3-5  dorsal interossei     Kinesiotex Create Space       Kinesiotix   Create Space 1 I strip for L metatarsal decompression 4-5      Ankle Exercises: Stretches   Soleus Stretch 2 reps;30 seconds   R   Gastroc Stretch Limitations 30 sec x 3;      Ankle Exercises: Aerobic   Recumbent Bike L1 x 5 min;      Ankle Exercises: Standing   SLS 30sec x 3 bil with head turns L/R x 10 each;    Heel Raises --    Other Standing Ankle Exercises Tandem stance 30 sec x 3 bil;      Ankle Exercises: Seated   Other Seated Ankle Exercises INv/Ev RTB x 15 each;    Other Seated Ankle Exercises Seated HR x 15;                    PT Short Term Goals - 11/10/20 1153      PT SHORT TERM GOAL #1   Title Pt to be independent with initial HEP    Time 2    Period Weeks    Status New    Target  Date 11/24/20      PT SHORT TERM GOAL #2   Title Pt to be compliant with wearing met pad, for pressure/ pain relief.    Time 2    Period Weeks    Status New    Target Date 11/24/20             PT Long Term Goals - 11/10/20 1154      PT LONG TERM GOAL #1   Title Pt to be independent with final HEP    Time 6    Period Weeks    Status New    Target Date 12/22/20      PT LONG TERM GOAL #2   Title Pt to report decreased pain in L foot/toes to 0-2/10 with standing/ambulation    Time 6    Period Weeks    Status New    Target Date 12/22/20      PT LONG TERM GOAL #3   Title Pt to demo improved ability for ambulation up to 1 hr without pain in foot greater thatn 2/10    Time 6    Period Weeks    Status New    Target Date 12/22/20      PT LONG TERM GOAL #4   Title Pt to be compliant with footwear and/or orthotics as approprate for her foot type for pressure relief and pain relief;    Time 6    Period Weeks    Status New    Target Date 12/22/20                 Plan - 11/16/20 1153    Clinical Impression Statement Pt with most soreness at 3-4 and 4-5 metatarsal and interossei. Pt very focused on prior pain in MTPs, but does not appear to  have much pain in joint at this time with assessment today and last visit. Continued [place], taping and manual for pain. Pain has been variable, discussed not over doing too much standing activity this week.    Examination-Activity Limitations Stand;Squat;Stairs    Examination-Participation Restrictions Church;Cleaning;Meal Prep;Community Activity;Shop    Stability/Clinical Decision Making Evolving/Moderate complexity    Rehab Potential Good    PT Frequency 2x / week    PT Duration 6 weeks    PT Treatment/Interventions ADLs/Self Care Home Management;Cryotherapy;Electrical Stimulation;Iontophoresis 58m/ml Dexamethasone;Moist Heat;Traction;Ultrasound;Therapeutic exercise;Therapeutic activities;Functional mobility training;Stair training;Gait training;DME Instruction;Balance training;Neuromuscular re-education;Patient/family education;Orthotic Fit/Training;Manual techniques;Passive range of motion;Dry needling;Taping;Vasopneumatic Device;Joint Manipulations;Spinal Manipulations    Consulted and Agree with Plan of Care Patient           Patient will benefit from skilled therapeutic intervention in order to improve the following deficits and impairments:  Pain,Increased muscle spasms,Decreased activity tolerance,Decreased strength,Difficulty walking  Visit Diagnosis: Pain in left ankle and joints of left foot  Pain in toe of left foot     Problem List Patient Active Problem List   Diagnosis Date Noted  . Left foot pain 10/20/2020  . 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 Neuroma 05/29/2020    LLyndee Hensen PT, DPT 11:55 AM  11/16/20    [facility] [place] [address]Phone: [phone]  Fax:  [phone] Name: Marissa MililloMRN: [id number] of Birth: 106-09-1951

## 2020-11-19 ENCOUNTER — Ambulatory Visit: Payer: Medicare HMO | Admitting: Physical Therapy

## 2020-11-19 ENCOUNTER — Other Ambulatory Visit: Payer: Self-pay

## 2020-11-19 ENCOUNTER — Encounter: Payer: Self-pay | Admitting: Physical Therapy

## 2020-11-19 DIAGNOSIS — M25572 Pain in left ankle and joints of left foot: Secondary | ICD-10-CM

## 2020-11-19 DIAGNOSIS — M79675 Pain in left toe(s): Secondary | ICD-10-CM

## 2020-11-19 NOTE — Therapy (Addendum)
[facility] [address] [place], [place], [address] Phone: [phone]   Fax:  [phone]  Physical Therapy Treatment  Patient Details  Name: Marissa [name] MRN: [id number] Date of Birth: [birth year] Referring Provider (PT): Charlann Boxer   Encounter Date: 11/19/2020   PT End of Session - 11/19/20 2113    Visit Number 4    Number of Visits 12    Date for PT Re-Evaluation 12/22/20    Authorization Type Aetna    PT Start Time 0845    PT Stop Time 0925    PT Time Calculation (min) 40 min    Activity Tolerance Patient tolerated treatment well    Behavior During Therapy [facility] for tasks assessed/performed           History reviewed. No pertinent past medical history.  History reviewed. No pertinent surgical history.  There were no vitals filed for this visit.   Subjective Assessment - 11/19/20 2111    Subjective Pt states increased pain in last couple days, increased with increased activity, standing, walking.    Limitations Standing;Walking    Currently in Pain? Yes    Pain Score 5     Pain Location Foot    Pain Orientation Left    Pain Descriptors / Indicators Aching;Throbbing    Pain Type Acute pain;Chronic pain    Pain Onset More than a month ago    Pain Frequency Intermittent                             [facility] Adult PT Treatment/Exercise - 11/19/20 0001      Self-Care   Other Self-Care Comments  Met pad grided down for improved comfort      Exercises   Exercises Ankle      Modalities   Modalities Iontophoresis      Iontophoresis   Type of Iontophoresis Dexamethasone    Location 4-5 distal metatarsal    Time 4 hr patch      Manual Therapy   Manual Therapy Joint mobilization;Soft tissue mobilization;Taping    Soft tissue mobilization STM/IASTM for dorsal foot and Extensors      Ankle Exercises: Stretches   Soleus Stretch --   R   Gastroc Stretch Limitations --      Ankle Exercises: Aerobic    Recumbent Bike L1 x 5 min;      Ankle Exercises: Standing   SLS 30sec x 3 bil with head turns L/R x 10 each;    Other Standing Ankle Exercises Tandem stance 30 sec x 3 bil;      Ankle Exercises: Seated   Other Seated Ankle Exercises INv/Ev RTB x 15 each;    Other Seated Ankle Exercises Seated HR x 15;                    PT Short Term Goals - 11/10/20 1153      PT SHORT TERM GOAL #1   Title Pt to be independent with initial HEP    Time 2    Period Weeks    Status New    Target Date 11/24/20      PT SHORT TERM GOAL #2   Title Pt to be compliant with wearing met pad, for pressure/ pain relief.    Time 2    Period Weeks    Status New    Target Date 11/24/20  PT Long Term Goals - 11/10/20 1154      PT LONG TERM GOAL #1   Title Pt to be independent with final HEP    Time 6    Period Weeks    Status New    Target Date 12/22/20      PT LONG TERM GOAL #2   Title Pt to report decreased pain in L foot/toes to 0-2/10 with standing/ambulation    Time 6    Period Weeks    Status New    Target Date 12/22/20      PT LONG TERM GOAL #3   Title Pt to demo improved ability for ambulation up to 1 hr without pain in foot greater thatn 2/10    Time 6    Period Weeks    Status New    Target Date 12/22/20      PT LONG TERM GOAL #4   Title Pt to be compliant with footwear and/or orthotics as approprate for her foot type for pressure relief and pain relief;    Time 6    Period Weeks    Status New    Target Date 12/22/20                 Plan - 11/19/20 2114    Clinical Impression Statement Pt with increased pain in last couple days. Most pain in distal mets and musculature. Continued manual and added [place] for pain. Continues to have increased pain with increased standing activity.    Examination-Activity Limitations Stand;Squat;Stairs    Examination-Participation Restrictions Church;Cleaning;Meal Prep;Community Activity;Shop     Stability/Clinical Decision Making Evolving/Moderate complexity    Rehab Potential Good    PT Frequency 2x / week    PT Duration 6 weeks    PT Treatment/Interventions ADLs/Self Care Home Management;Cryotherapy;Electrical Stimulation;Iontophoresis 29m/ml Dexamethasone;Moist Heat;Traction;Ultrasound;Therapeutic exercise;Therapeutic activities;Functional mobility training;Stair training;Gait training;DME Instruction;Balance training;Neuromuscular re-education;Patient/family education;Orthotic Fit/Training;Manual techniques;Passive range of motion;Dry needling;Taping;Vasopneumatic Device;Joint Manipulations;Spinal Manipulations    Consulted and Agree with Plan of Care Patient           Patient will benefit from skilled therapeutic intervention in order to improve the following deficits and impairments:  Pain,Increased muscle spasms,Decreased activity tolerance,Decreased strength,Difficulty walking  Visit Diagnosis: Pain in left ankle and joints of left foot  Pain in toe of left foot     Problem List Patient Active Problem List   Diagnosis Date Noted  . Left foot pain 10/20/2020  . 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 Neuroma 05/29/2020    LLyndee Hensen PT, DPT 9:15 PM  11/19/20    [place] [address]Phone: [phone]  Fax:  [phone] Name: KAstha ProbascoMRN: [id number] of Birth: [birth year]  PHYSICAL THERAPY DISCHARGE SUMMARY  Visits from Start of Care: 4 Plan: Patient agrees to discharge.  Patient goals were not met. Patient is being discharged due to not returning since the last visit.  ?????    Pt with continued pain, wanted to f/u with MD.   LLyndee Hensen PT, DPT 12:04 PM  03/31/21

## 2020-11-23 ENCOUNTER — Encounter: Payer: Self-pay | Admitting: Family Medicine

## 2020-11-24 ENCOUNTER — Other Ambulatory Visit: Payer: Self-pay

## 2020-11-24 ENCOUNTER — Ambulatory Visit: Payer: Medicare HMO | Admitting: Neurology

## 2020-11-24 DIAGNOSIS — R202 Paresthesia of skin: Secondary | ICD-10-CM | POA: Diagnosis not present

## 2020-11-24 NOTE — Procedures (Signed)
[facility]  [place], [place]  [place], [place] [address] Tel: [phone] Fax:  [phone] Test Date:  11/24/2020  Patient: Marissa [name] DOB: 1951/08/19 Physician: Narda Amber, DO  Sex: Female Height: 5\' 3"  Ref Phys: Hulan Saas, DO  ID#: [id number]   Technician:    Patient Complaints: This is a 69 year old female referred for evaluation of left foot pain.  NCV & EMG Findings: Extensive electrodiagnostic testing of the left lower extremity shows:  1. Left sural and superficial peroneal sensory responses are within normal limits. 2. Left peroneal and tibial motor responses are within normal limits. 3. Left tibial H reflex study is within normal limits. 4. There is no evidence of active or chronic motor axonal loss changes affecting any of the tested muscles.  Motor unit configuration and recruitment pattern is within normal limits.  Impression: This is a normal study of the left lower extremity.  In particular, there is no evidence of a sensorimotor polyneuropathy or lumbosacral radiculopathy.   ___________________________ Narda Amber, DO    Nerve Conduction Studies Anti Sensory Summary Table   Stim Site NR Peak (ms) Norm Peak (ms) P-T Amp (V) Norm P-T Amp  Left Sup Peroneal Anti Sensory (Ant Lat Mall)  32C  12 cm    2.5 <4.6 16.3 >3  Left Sural Anti Sensory (Lat Mall)  32C  Calf    3.9 <4.6 24.8 >3   Motor Summary Table   Stim Site NR Onset (ms) Norm Onset (ms) O-P Amp (mV) Norm O-P Amp Site1 Site2 Delta-0 (ms) Dist (cm) Vel (m/s) Norm Vel (m/s)  Left Peroneal Motor (Ext Dig Brev)  32C  Ankle    4.1 <6.0 4.3 >2.5 B Fib Ankle 5.9 34.0 58 >40  B Fib    10.0  4.1  Poplt B Fib 1.3 7.0 54 >40  Poplt    11.3  3.9         Left Tibial Motor (Abd Hall Brev)  32C  Ankle    3.3 <6.0 15.5 >4 Knee Ankle 7.8 38.0 49 >40  Knee    11.1  10.9          H Reflex Studies   NR H-Lat (ms) Lat Norm (ms) L-R H-Lat (ms)  Left Tibial (Gastroc)  32C      31.84 <35    EMG   Side Muscle Ins Act Fibs Psw Fasc Number Recrt Dur Dur. Amp Amp. Poly Poly. Comment  Left AntTibialis Nml Nml Nml Nml Nml Nml Nml Nml Nml Nml Nml Nml N/A  Left Gastroc Nml Nml Nml Nml Nml Nml Nml Nml Nml Nml Nml Nml N/A  Left Flex Dig Long Nml Nml Nml Nml Nml Nml Nml Nml Nml Nml Nml Nml N/A  Left RectFemoris Nml Nml Nml Nml Nml Nml Nml Nml Nml Nml Nml Nml N/A  Left GluteusMed Nml Nml Nml Nml Nml Nml Nml Nml Nml Nml Nml Nml N/A  Left BicepsFemS Nml Nml Nml Nml Nml Nml Nml Nml Nml Nml Nml Nml N/A      Waveforms:

## 2020-11-25 ENCOUNTER — Encounter: Payer: Self-pay | Admitting: Family Medicine

## 2020-11-26 ENCOUNTER — Telehealth: Payer: Self-pay | Admitting: Family Medicine

## 2020-11-26 NOTE — Telephone Encounter (Signed)
Pt does not have access to MyChart at home, so I read her Dr. Thompson Caul MyChart response from this morning. Please call her today if a response or recommendation is needed  She is wearing her orthotics, not going barefoot. Tried essential oils but that made the burning worse. Is currently taking Advil every 4 hours, resting, and icing. Has an appt 12/30.

## 2020-12-02 NOTE — Progress Notes (Signed)
[facility] [address] [place] [address] Phone: [phone] Subjective:   Bruce Donath, am serving as a scribe for Dr. Antoine Primas. This visit occurred during the SARS-CoV-2 public health emergency.  Safety protocols were in place, including screening questions prior to the visit, additional usage of staff PPE, and extensive cleaning of exam room while observing appropriate contact time as indicated for disinfecting solutions.   I'm seeing this patient by the request  of:  Arlyce Dice, Kristen W., PA-C  CC: Left foot pain  GGY:[id number]   10/20/2020 Patient continues to have trouble more with this.  Patient's MRI was completely unremarkable, last 2 ultrasounds also fairly unremarkable.  Patient is so far Doppler was unremarkable and awaiting ABI and nerve conduction test.  If all is normal differential includes irritation of the skin or potentially reflect sympathetic dystrophy.  If so I would consider the possibility of a low-dose Cymbalta.  Could also consider the possibility of increasing gabapentin.  Discussed icing regimen and home exercises otherwise and will send to formal physical therapy for evaluation and see if anything else could help with some of the pain.  May need to consider the possibility of custom orthotics.  Follow-up with me again in 6 to 8 weeks  Update 11/23/2020 Marissa [name] is a 69 y.o. female coming in with complaint of left foot pain. Pain in metatarsal heads has gotten worse. Is using orthotics, doing PT and not going barefoot. Pain over 1st MTP. Also having burning in right foot as well. Feels like her pain developed after using bike at physical therapy. Burning was keeping her up at night.      No past medical history on file. No past surgical history on file. Social History   Socioeconomic History  . Marital status: Married    Spouse name: Not on file  . Number of children: Not on file  . Years of education: Not on  file  . Highest education level: Not on file  Occupational History  . Not on file  Tobacco Use  . Smoking status: Not on file  . Smokeless tobacco: Not on file  Substance and Sexual Activity  . Alcohol use: Not on file  . Drug use: Not on file  . Sexual activity: Not on file  Other Topics Concern  . Not on file  Social History Narrative  . Not on file   Social Determinants of Health   Financial Resource Strain: Not on file  Food Insecurity: Not on file  Transportation Needs: Not on file  Physical Activity: Not on file  Stress: Not on file  Social Connections: Not on file   Allergies  Allergen Reactions  . Penicillins Other (See Comments) and Hives  . Codeine Other (See Comments)    Other  . Pseudoephedrine Hcl Nausea Only   No family history on file.  Current Outpatient Medications (Endocrine & Metabolic):  .  predniSONE (DELTASONE) 50 MG tablet, Take one tablet daily for the next 5 days.      Current Outpatient Medications (Other):  .  gabapentin (NEURONTIN) 100 MG capsule, Take 2 capsules (200 mg total) by mouth at bedtime. .  Vitamin D, Ergocalciferol, (DRISDOL) 1.25 MG (50000 UNIT) CAPS capsule, TAKE 1 CAPSULE (50,000 UNITS TOTAL) BY MOUTH EVERY 7 (SEVEN) DAYS.   Reviewed prior external information including notes and imaging from  primary care provider As well as notes that were available from care everywhere and other healthcare systems.  Past  medical history, social, surgical and family history all reviewed in electronic medical record.  No pertanent information unless stated regarding to the chief complaint.   Review of Systems:  No headache, visual changes, nausea, vomiting, diarrhea, constipation, dizziness, abdominal pain, skin rash, fevers, chills, night sweats, weight loss, swollen lymph nodes, body aches, joint swelling, chest pain, shortness of breath, mood changes. POSITIVE muscle aches  Objective  Blood pressure 124/82, pulse 60, height 5\' 3"   (1.6 m), weight 128 lb (58.1 kg), SpO2 99 %.   General: No apparent distress alert and oriented x3 mood and affect normal, dressed appropriately.  HEENT: Pupils equal, extraocular movements intact  Respiratory: Patient's speak in full sentences and does not appear short of breath  Cardiovascular: No lower extremity edema, non tender, no erythema   Gait normal with good balance and coordination.  MSK:  Non tender with full range of motion and good stability and symmetric strength and tone of shoulders, elbows, wrist, hip, knee and ankles bilaterally.  Foot exam bilaterally shows that patient does have breakdown of the transverse arch left greater than right.  Patient has a negative squeeze test though noted today.  Patient does have diffuse tenderness over the dorsal aspect of the plantar aspect of the toes.  Good capillary refill the patient does have coldness noted.  Limited musculoskeletal ultrasound was performed and interpreted by Lyndal Pulley  Limited ultrasound of patient's foot does not show any significant abnormality noted at this time.  I do not see any reaccumulation of a neuroma.  Patient has no capsulitis noted of the 1st toe.  I do not see any cortical irregularity otherwise. Impression: Unremarkable ultrasound of the left foot   Impression and Recommendations:     The above documentation has been reviewed and is accurate and complete Lyndal Pulley, DO

## 2020-12-03 ENCOUNTER — Ambulatory Visit (INDEPENDENT_AMBULATORY_CARE_PROVIDER_SITE_OTHER): Payer: Medicare HMO

## 2020-12-03 ENCOUNTER — Ambulatory Visit: Payer: Medicare HMO | Admitting: Family Medicine

## 2020-12-03 ENCOUNTER — Ambulatory Visit: Payer: Self-pay

## 2020-12-03 ENCOUNTER — Encounter: Payer: Self-pay | Admitting: Family Medicine

## 2020-12-03 ENCOUNTER — Other Ambulatory Visit: Payer: Self-pay

## 2020-12-03 VITALS — BP 124/82 | HR 60 | Ht 63.0 in | Wt 128.0 lb

## 2020-12-03 DIAGNOSIS — M2550 Pain in unspecified joint: Secondary | ICD-10-CM

## 2020-12-03 DIAGNOSIS — M79672 Pain in left foot: Secondary | ICD-10-CM

## 2020-12-03 DIAGNOSIS — M545 Low back pain, unspecified: Secondary | ICD-10-CM

## 2020-12-03 LAB — CBC WITH DIFFERENTIAL/PLATELET
Basophils Absolute: 0 10*3/uL (ref 0.0–0.1)
Basophils Relative: 0.3 % (ref 0.0–3.0)
Eosinophils Absolute: 0.1 10*3/uL (ref 0.0–0.7)
Eosinophils Relative: 1.2 % (ref 0.0–5.0)
HCT: 39.4 % (ref 36.0–46.0)
Hemoglobin: 13.3 g/dL (ref 12.0–15.0)
Lymphocytes Relative: 34.1 % (ref 12.0–46.0)
Lymphs Abs: 2.1 10*3/uL (ref 0.7–4.0)
MCHC: 33.8 g/dL (ref 30.0–36.0)
MCV: 91.8 fl (ref 78.0–100.0)
Monocytes Absolute: 0.4 10*3/uL (ref 0.1–1.0)
Monocytes Relative: 6 % (ref 3.0–12.0)
Neutro Abs: 3.6 10*3/uL (ref 1.4–7.7)
Neutrophils Relative %: 58.4 % (ref 43.0–77.0)
Platelets: 313 10*3/uL (ref 150.0–400.0)
RBC: 4.29 Mil/uL (ref 3.87–5.11)
RDW: 13.1 % (ref 11.5–15.5)
WBC: 6.2 10*3/uL (ref 4.0–10.5)

## 2020-12-03 LAB — COMPREHENSIVE METABOLIC PANEL
ALT: 19 U/L (ref 0–35)
AST: 20 U/L (ref 0–37)
Albumin: 4.6 g/dL (ref 3.5–5.2)
Alkaline Phosphatase: 47 U/L (ref 39–117)
BUN: 12 mg/dL (ref 6–23)
CO2: 30 mEq/L (ref 19–32)
Calcium: 9.4 mg/dL (ref 8.4–10.5)
Chloride: 103 mEq/L (ref 96–112)
Creatinine, Ser: 0.66 mg/dL (ref 0.40–1.20)
GFR: 90.89 mL/min (ref >60.00)
Glucose, Bld: 91 mg/dL (ref 70–99)
Potassium: 4 mEq/L (ref 3.5–5.1)
Sodium: 139 mEq/L (ref 135–145)
Total Bilirubin: 0.3 mg/dL (ref 0.2–1.2)
Total Protein: 7.3 g/dL (ref 6.0–8.3)

## 2020-12-03 LAB — IBC PANEL
Iron: 89 ug/dL (ref 42–145)
Saturation Ratios: 25.1 % (ref 20.0–50.0)
Transferrin: 253 mg/dL (ref 212.0–360.0)

## 2020-12-03 LAB — VITAMIN D 25 HYDROXY (VIT D DEFICIENCY, FRACTURES): VITD: 35.83 ng/mL (ref 30.00–100.00)

## 2020-12-03 LAB — TSH: TSH: 1.16 u[IU]/mL (ref 0.35–4.50)

## 2020-12-03 LAB — SEDIMENTATION RATE: Sed Rate: 12 mm/hr (ref 0–30)

## 2020-12-03 LAB — C-REACTIVE PROTEIN: CRP: <1 mg/dL (ref 0.5–20.0)

## 2020-12-03 LAB — URIC ACID: Uric Acid, Serum: 3.4 mg/dL (ref 2.4–7.0)

## 2020-12-03 MED ORDER — PREDNISONE 20 MG PO TABS: 40.0000 mg | ORAL_TABLET | Freq: Every day | ORAL | 0 refills | Status: DC

## 2020-12-03 NOTE — Assessment & Plan Note (Signed)
Foot exam is fairly unremarkable at this time.  Patient does have a breakdown of the transverse arch secondary point overall.  Patient has a nerve conduction test, ABIs, Dopplers, and MRI that has not shown any significant findings.  Patient has been doing the orthotics and encouraged her to stop using those as frequently.  Increase gabapentin to 300 mg.  Another course of prednisone given while patient was on vacation.  Laboratory work-up to further evaluate if anything systemic could be contributing to this pain.  X-rays of the lumbar spine to make sure there is no other possibility of radiculopathy could be contributing.  We discussed also that there could be left leg sympathetic dystrophy and we discussed Cymbalta which patient has declined.  Discussed the potential for Raynaud's syndrome but does not correspond symptomatically appropriately.  Patient will try these interventions and see me again in 6 weeks if continuing to have trouble may need to consider the possibility of referral to another provider for further evaluation

## 2020-12-03 NOTE — Patient Instructions (Signed)
Lumbar xray Labs today Increase gabapentin 300mg  at night Pred 40mg  daily for 5 days-started day before your trip Can use lidocaine cream Try to keep feet warm and avoid ice for now [place] or [employer] Recover Sandals in the house Have an appt set up in 6 weeks

## 2020-12-07 LAB — PTH, INTACT AND CALCIUM
Calcium: 9.6 mg/dL (ref 8.6–10.4)
PTH: 26 pg/mL (ref 14–64)

## 2020-12-07 LAB — RHEUMATOID FACTOR: Rheumatoid fact SerPl-aCnc: <14 IU/mL (ref <14)

## 2020-12-07 LAB — CALCIUM, IONIZED: Calcium, Ion: 4.9 mg/dL (ref 4.8–5.6)

## 2020-12-07 LAB — ANA: Anti Nuclear Antibody (ANA): NEGATIVE

## 2021-01-12 ENCOUNTER — Encounter: Payer: Self-pay | Admitting: Family Medicine

## 2021-01-13 NOTE — Progress Notes (Signed)
[facility] [address] [place] Phone: [phone] Subjective:   I Marissa [name] am serving as a Education administrator for Dr. Hulan Saas.  This visit occurred during the SARS-CoV-2 public health emergency.  Safety protocols were in place, including screening questions prior to the visit, additional usage of staff PPE, and extensive cleaning of exam room while observing appropriate contact time as indicated for disinfecting solutions.   I'm seeing this patient by the request  of:  Deatra Ina, Kristen W., PA-C  CC: Left foot pain follow-up  YHC:[id number]   12/03/2020 Foot exam is fairly unremarkable at this time.  Patient does have a breakdown of the transverse arch secondary point overall.  Patient has a nerve conduction test, ABIs, Dopplers, and MRI that has not shown any significant findings.  Patient has been doing the orthotics and encouraged her to stop using those as frequently.  Increase gabapentin to 300 mg.  Another course of prednisone given while patient was on vacation.  Laboratory work-up to further evaluate if anything systemic could be contributing to this pain.  X-rays of the lumbar spine to make sure there is no other possibility of radiculopathy could be contributing.  We discussed also that there could be left leg sympathetic dystrophy and we discussed Cymbalta which patient has declined.  Discussed the potential for Raynaud's syndrome but does not correspond symptomatically appropriately.  Patient will try these interventions and see me again in 6 weeks if continuing to have trouble may need to consider the possibility of referral to another provider for further evaluation  Update 01/14/2021 Marissa [name] is a 70 y.o. female coming in with complaint of left foot pain. Patient continues to have pain toe and burning over metatarsal heads especially with biking at rehab. Has has massage therapist work on foot since last visit. Started on gabapentin  last visit. Patient states she thought it was getting better. Went to PT in December and used a stationary bike that made the foot worse. Burning has subsided. Medical masseuse believes the fall on the left hip is what is contributing to the toe pain. Masseuse massaged the toe and the patient believes that she did a little too much. States she is still in a lot of pain. Pain is on the lateral side.  Patient states that it just continues to give her a lot of pain and has been has difficulty wearing shoes because of her lateral foot pain.  Still seems to be on the dorsal aspect of the foot.  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  NCV 11/24/20- normal  ABI Normal  MRI of foot 11/21- normal     No past medical history on file. No past surgical history on file. Social History   Socioeconomic History  . Marital status: Married    Spouse name: Not on file  . Number of children: Not on file  . Years of education: Not on file  . Highest education level: Not on file  Occupational History  . Not on file  Tobacco Use  . Smoking status: Not on file  . Smokeless tobacco: Not on file  Substance and Sexual Activity  . Alcohol use: Not on file  . Drug use: Not on file  . Sexual activity: Not on file  Other Topics Concern  . Not on file  Social History Narrative  .  Not on file   Social Determinants of Health   Financial Resource Strain: Not on file  Food Insecurity: Not on file  Transportation Needs: Not on file  Physical Activity: Not on file  Stress: Not on file  Social Connections: Not on file   Allergies  Allergen Reactions  . Penicillins Other (See Comments) and Hives  . Codeine Other (See Comments)    Other  . Pseudoephedrine Hcl Nausea Only   No family history on file.  Current Outpatient Medications (Endocrine & Metabolic):  .  predniSONE (DELTASONE) 20 MG  tablet, Take 2 tablets (40 mg total) by mouth daily with breakfast. .  predniSONE (DELTASONE) 50 MG tablet, Take one tablet daily for the next 5 days.      Current Outpatient Medications (Other):  .  gabapentin (NEURONTIN) 100 MG capsule, Take 2 capsules (200 mg total) by mouth at bedtime. .  Vitamin D, Ergocalciferol, (DRISDOL) 1.25 MG (50000 UNIT) CAPS capsule, TAKE 1 CAPSULE (50,000 UNITS TOTAL) BY MOUTH EVERY 7 (SEVEN) DAYS.   Reviewed prior external information including notes and imaging from  primary care provider As well as notes that were available from care everywhere and other healthcare systems.  Past medical history, social, surgical and family history all reviewed in electronic medical record.  No pertanent information unless stated regarding to the chief complaint.   Review of Systems:  No headache, visual changes, nausea, vomiting, diarrhea, constipation, dizziness, abdominal pain, skin rash, fevers, chills, night sweats, weight loss, swollen lymph nodes, body aches, joint swelling, chest pain, shortness of breath, mood changes. POSITIVE muscle aches but only in the foot  Objective  Blood pressure 130/80, pulse 68, height 5\' 3"  (1.6 m), weight 129 lb (58.5 kg), SpO2 100 %.   General: No apparent distress alert and oriented x3 mood and affect normal, dressed appropriately.  HEENT: Pupils equal, extraocular movements intact  Respiratory: Patient's speak in full sentences and does not appear short of breath  Cardiovascular: No lower extremity edema, non tender, no erythema  Gait normal with good balance and coordination.  MSK:  Non tender with full range of motion and good stability and symmetric strength and tone of shoulders, elbows, wrist, hip, knee and ankles bilaterally.  Patient's left foot still shows a breakdown of the transverse arch noted.  No significant discomfort over the cuboid and do not notice any difficulty.  Patient's pain still seems to be more on the  dorsal aspect of the foot between the fourth and fifth metatarsal heads but difficult to find completely.  Patient has full range of motion, good capillary refill in the area.  No gross deformity of the foot noted otherwise.   Impression and Recommendations:     The above documentation has been reviewed and is accurate and complete Lyndal Pulley, DO

## 2021-01-14 ENCOUNTER — Encounter: Payer: Self-pay | Admitting: Family Medicine

## 2021-01-14 ENCOUNTER — Ambulatory Visit: Payer: Medicare HMO | Admitting: Family Medicine

## 2021-01-14 ENCOUNTER — Other Ambulatory Visit: Payer: Self-pay

## 2021-01-14 VITALS — BP 130/80 | HR 68 | Ht 63.0 in | Wt 129.0 lb

## 2021-01-14 DIAGNOSIS — M79672 Pain in left foot: Secondary | ICD-10-CM

## 2021-01-14 NOTE — Patient Instructions (Addendum)
Good to see you Sorry I don't have a good answer Dr. Scheryl Darter Orth[facility] and see if he knows what is going on I am here if you have questions

## 2021-01-14 NOTE — Assessment & Plan Note (Signed)
Discussed with patient again at great length.  We have done significant work-up for this previously.  Patient has had a nerve conduction test, ABIs and an MRI that did not show any significant findings.  Ultrasounds at bedside also has not seen anything glaring at this time.  Patient was making some mild progress with a physical therapist but then seems to have actually aggravated the other day.  We discussed the differential includes the possible of Raynaud's but patient would like to avoid medications.  We discussed potentially of a sympathetic dystrophy and discussed Cymbalta previously and patient wants to decline that as well.  I do feel at this point referral to a foot and ankle specialist for further evaluation of this pain would be beneficial.  Patient can continue the gabapentin that has helped her with some of the nighttime pain.  Patient will follow up with me on an as-needed basis.  Total time reviewing patient's imaging as well as discussing with patient today 34 minutes

## 2021-03-07 ENCOUNTER — Encounter: Payer: Self-pay | Admitting: Family Medicine

## 2021-03-08 MED ORDER — DULOXETINE HCL 20 MG PO CPEP: 20.0000 mg | ORAL_CAPSULE | Freq: Every day | ORAL | 3 refills | Status: DC

## 2021-03-08 MED ORDER — PREDNISONE 20 MG PO TABS: 20.0000 mg | ORAL_TABLET | Freq: Every day | ORAL | 0 refills | Status: AC

## 2021-03-08 NOTE — Telephone Encounter (Signed)
Pt confirms she wants to try the 10 day cycle of steroids. She also sounded interested in the Cymbalta, but I am not sure if we meant for her to try both or choose between the two.  Let me know and I will call her back.

## 2021-03-30 ENCOUNTER — Other Ambulatory Visit: Payer: Self-pay

## 2021-03-30 ENCOUNTER — Other Ambulatory Visit: Payer: Self-pay | Admitting: Family Medicine

## 2021-03-30 MED ORDER — DULOXETINE HCL 20 MG PO CPEP
20.0000 mg | ORAL_CAPSULE | Freq: Every day | ORAL | 0 refills | Status: DC
Start: 2021-03-30 — End: 2021-07-08

## 2021-05-22 ENCOUNTER — Other Ambulatory Visit: Payer: Self-pay | Admitting: Family Medicine

## 2021-05-24 NOTE — Telephone Encounter (Signed)
Sent patient MyChart message to see if she had another month remaining on 90 days that were called in 03/30/2021.

## 2021-07-08 ENCOUNTER — Other Ambulatory Visit: Payer: Self-pay | Admitting: Family Medicine

## 2021-09-01 ENCOUNTER — Other Ambulatory Visit: Payer: Self-pay | Admitting: Family Medicine

## 2021-10-01 ENCOUNTER — Encounter: Payer: Self-pay | Admitting: Family Medicine

## 2022-04-30 IMAGING — MR MR FOOT*L* W/O CM
5 series · 40 of 40 positions shown · non-contrast
Comparison: Left foot x-rays dated September 22, 2020.

CLINICAL DATA: Left fifth toe pain.

EXAM:
MRI OF THE LEFT FOOT WITHOUT CONTRAST
TECHNIQUE: Multiplanar, multisequence MR imaging of the left forefoot was
performed. No intravenous contrast was administered.

[Series 4: T1 · coronal · left · 3.0mm · 0.47mm/px · 11 of 40 slices shown (1 of 2)]
[im 1/40]
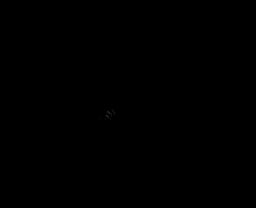
[im 4/40]
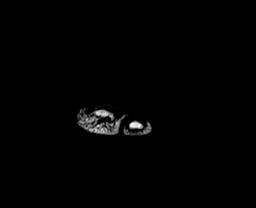
[im 8/40]
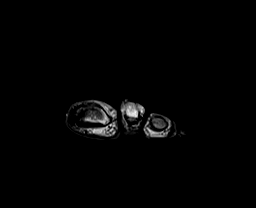
[im 12/40]
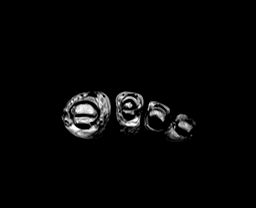
[im 16/40]
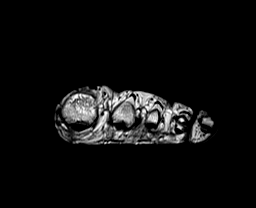
[im 20/40]
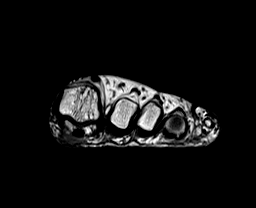
[im 24/40]
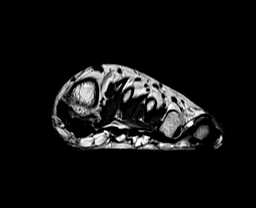
[im 28/40]
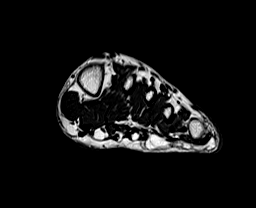
[im 32/40]
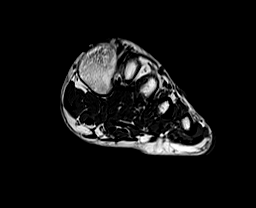
[im 36/40]
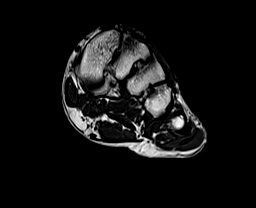
[im 40/40]
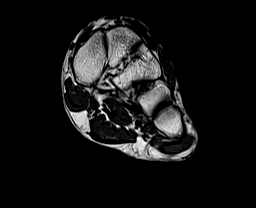

[Series 5: T2 fat-sat · coronal · left · 3.0mm · 0.31mm/px · 10 of 40 slices shown (1 of 2)]
[im 1/40]
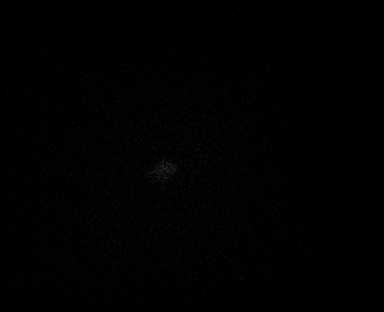
[im 5/40]
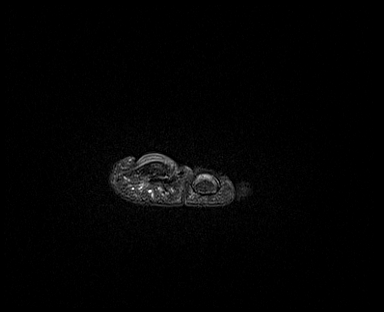
[im 9/40]
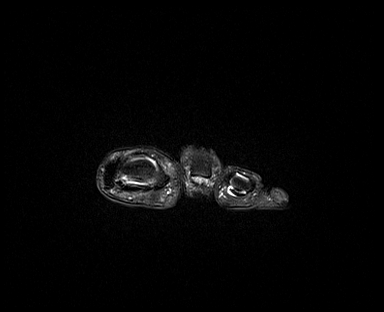
[im 14/40]
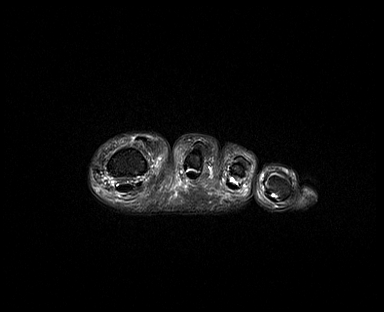
[im 18/40]
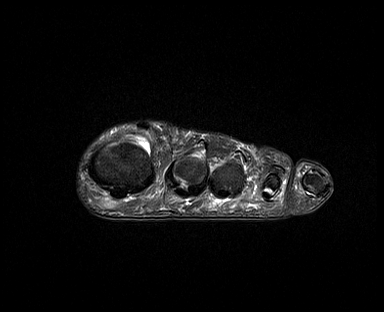
[im 22/40]
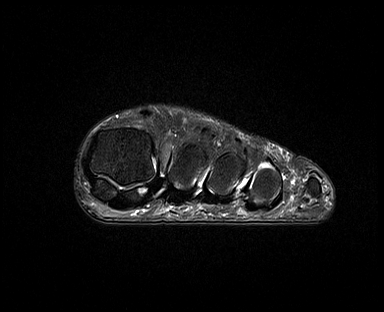
[im 27/40]
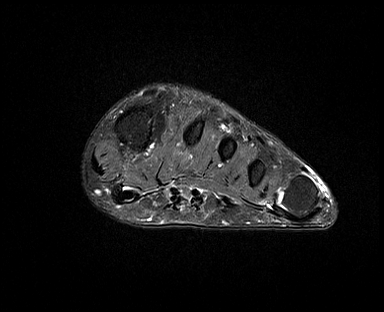
[im 31/40]
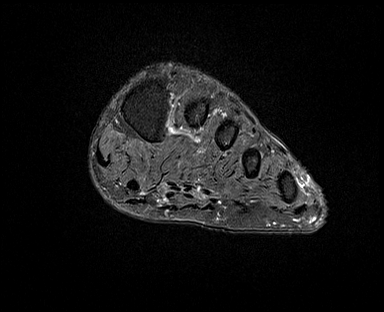
[im 35/40]
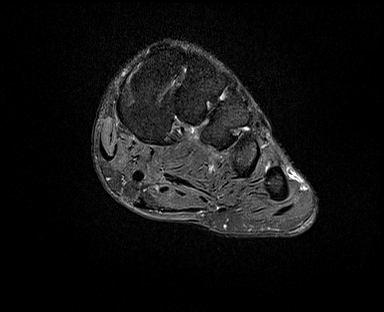
[im 40/40]
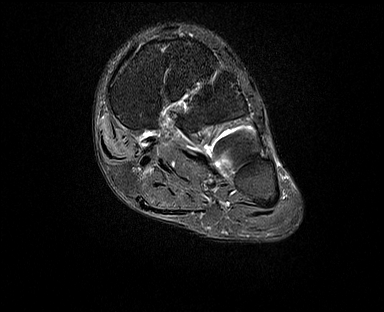

[Series 6: STIR · sagittal · left · 3.0mm · 0.70mm/px · 7 of 27 slices shown]
[im 1/27]
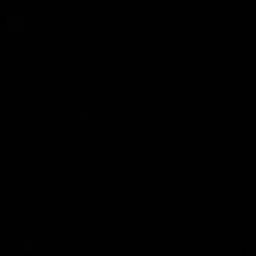
[im 5/27]
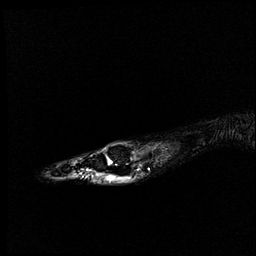
[im 9/27]
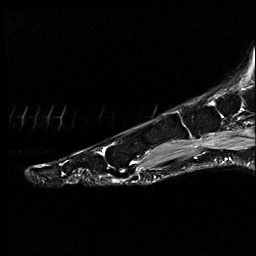
[im 14/27]
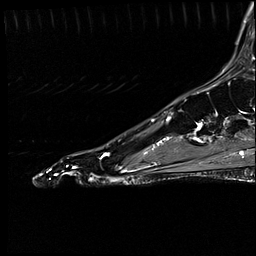
[im 18/27]
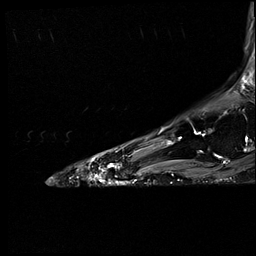
[im 22/27]
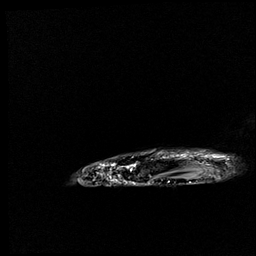
[im 27/27]
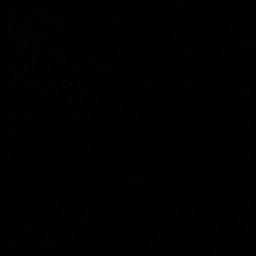

[Series 7: T1 · axial · left · 3.0mm · 0.47mm/px · z∈[-55,+29]mm · 6 of 23 slices shown (2 of 2)]
[im 1/23]
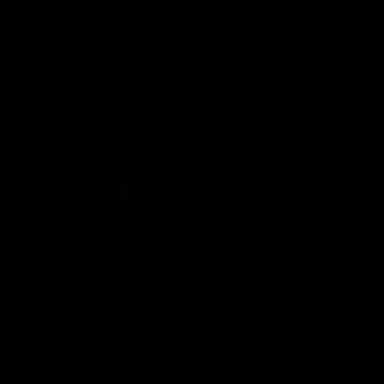
[im 5/23]
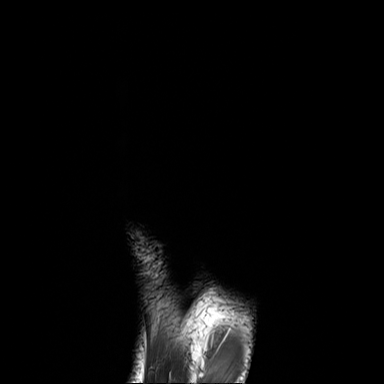
[im 9/23]
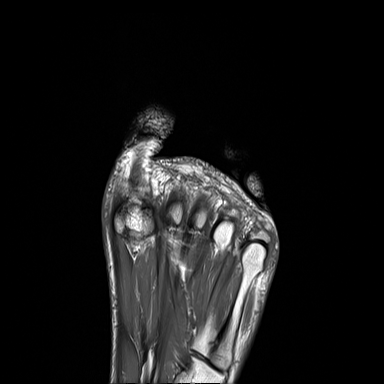
[im 14/23]
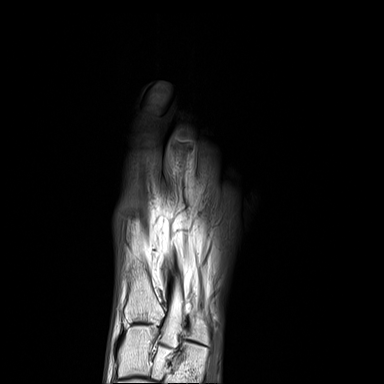
[im 18/23]
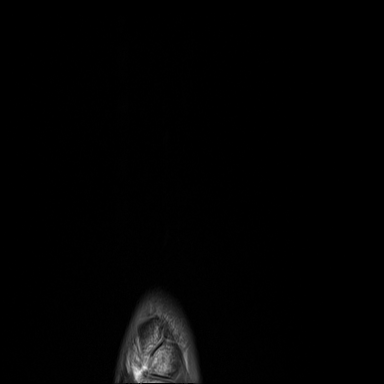
[im 23/23]
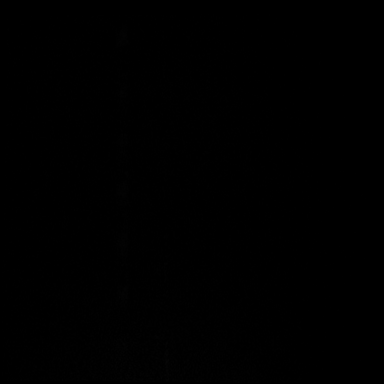

[Series 8: T2 fat-sat · axial · left · 3.0mm · 0.47mm/px · z∈[-55,+29]mm · 6 of 23 slices shown (2 of 2)]
[im 1/23]
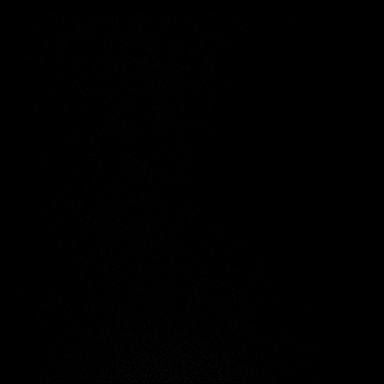
[im 5/23]
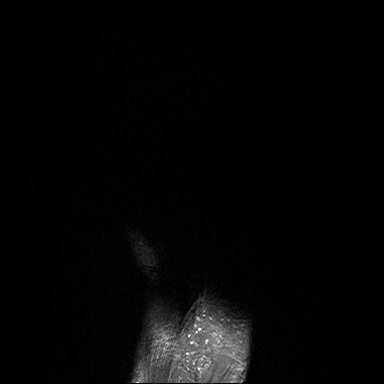
[im 9/23]
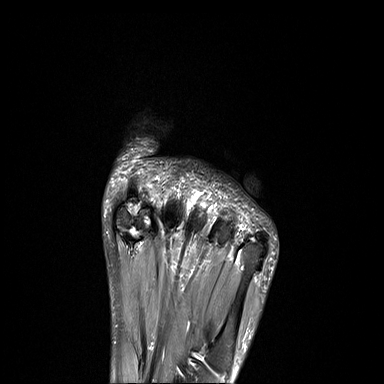
[im 14/23]
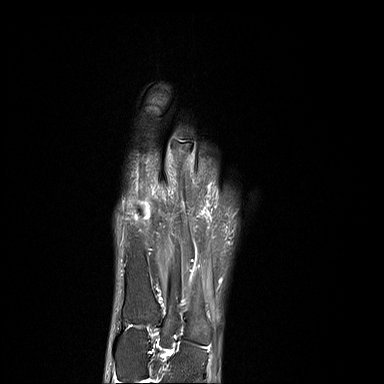
[im 18/23]
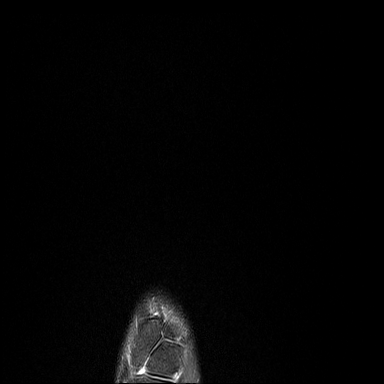
[im 23/23]
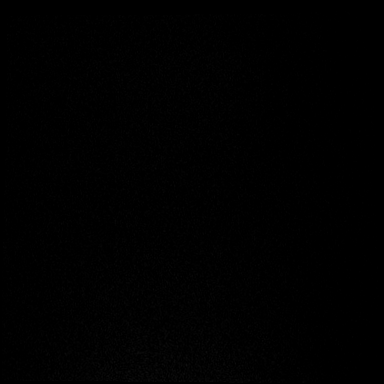

[40 of 40 positions shown; findings below may reference images not displayed]

FINDINGS: Bones/Joint/Cartilage

No marrow signal abnormality. No fracture or dislocation. Normal
alignment. No joint effusion.

Ligaments

Collateral ligaments are intact.  Lisfranc ligament is intact.

Muscles and Tendons
Flexor and extensor tendons are intact. No muscle edema or atrophy.

Soft tissue
No fluid collection or hematoma.  No soft tissue mass.
IMPRESSION: 1. Normal MRI of the left forefoot. No explanation for the patient's
pain.

## 2022-05-26 ENCOUNTER — Other Ambulatory Visit: Payer: Self-pay | Admitting: Family Medicine

## 2022-06-14 ENCOUNTER — Other Ambulatory Visit: Payer: Self-pay | Admitting: Family Medicine
